# Patient Record
Sex: Female | Born: 1975 | Race: White | Hispanic: No | Marital: Married | Smoking: Never smoker
Health system: Southern US, Community
[De-identification: ages and names within clinical notes are randomized; demographics above are authoritative.]

## PROBLEM LIST (undated history)

## (undated) DIAGNOSIS — D582 Other hemoglobinopathies: Secondary | ICD-10-CM

## (undated) DIAGNOSIS — N926 Irregular menstruation, unspecified: Secondary | ICD-10-CM

## (undated) DIAGNOSIS — N736 Female pelvic peritoneal adhesions (postinfective): Secondary | ICD-10-CM

## (undated) DIAGNOSIS — N979 Female infertility, unspecified: Secondary | ICD-10-CM

## (undated) DIAGNOSIS — N83209 Unspecified ovarian cyst, unspecified side: Secondary | ICD-10-CM

## (undated) DIAGNOSIS — R3915 Urgency of urination: Secondary | ICD-10-CM

## (undated) DIAGNOSIS — R319 Hematuria, unspecified: Secondary | ICD-10-CM

## (undated) DIAGNOSIS — N809 Endometriosis, unspecified: Secondary | ICD-10-CM

## (undated) DIAGNOSIS — IMO0002 Reserved for concepts with insufficient information to code with codable children: Secondary | ICD-10-CM

## (undated) DIAGNOSIS — I10 Essential (primary) hypertension: Secondary | ICD-10-CM

## (undated) DIAGNOSIS — K219 Gastro-esophageal reflux disease without esophagitis: Secondary | ICD-10-CM

## (undated) DIAGNOSIS — F419 Anxiety disorder, unspecified: Secondary | ICD-10-CM

## (undated) DIAGNOSIS — K589 Irritable bowel syndrome without diarrhea: Secondary | ICD-10-CM

## (undated) DIAGNOSIS — N898 Other specified noninflammatory disorders of vagina: Secondary | ICD-10-CM

## (undated) DIAGNOSIS — M5020 Other cervical disc displacement, unspecified cervical region: Secondary | ICD-10-CM

## (undated) DIAGNOSIS — E785 Hyperlipidemia, unspecified: Secondary | ICD-10-CM

## (undated) DIAGNOSIS — R87629 Unspecified abnormal cytological findings in specimens from vagina: Secondary | ICD-10-CM

## (undated) HISTORY — DX: Female infertility, unspecified: N97.9

## (undated) HISTORY — DX: Female pelvic peritoneal adhesions (postinfective): N73.6

## (undated) HISTORY — DX: Other cervical disc displacement, unspecified cervical region: M50.20

## (undated) HISTORY — DX: Irritable bowel syndrome, unspecified: K58.9

## (undated) HISTORY — DX: Hyperlipidemia, unspecified: E78.5

## (undated) HISTORY — DX: Unspecified ovarian cyst, unspecified side: N83.209

## (undated) HISTORY — DX: Unspecified abnormal cytological findings in specimens from vagina: R87.629

## (undated) HISTORY — DX: Urgency of urination: R39.15

## (undated) HISTORY — PX: MOLE REMOVAL: SHX2046

## (undated) HISTORY — PX: HERNIA REPAIR: SHX51

## (undated) HISTORY — DX: Other specified noninflammatory disorders of vagina: N89.8

## (undated) HISTORY — DX: Essential (primary) hypertension: I10

## (undated) HISTORY — DX: Anxiety disorder, unspecified: F41.9

## (undated) HISTORY — DX: Irregular menstruation, unspecified: N92.6

## (undated) HISTORY — DX: Reserved for concepts with insufficient information to code with codable children: IMO0002

## (undated) HISTORY — DX: Hematuria, unspecified: R31.9

## (undated) HISTORY — DX: Endometriosis, unspecified: N80.9

## (undated) HISTORY — DX: Other hemoglobinopathies: D58.2

## (undated) HISTORY — DX: Gastro-esophageal reflux disease without esophagitis: K21.9

---

## 2004-05-19 ENCOUNTER — Emergency Department: Payer: Self-pay | Admitting: Emergency Medicine

## 2005-05-24 ENCOUNTER — Observation Stay: Payer: Self-pay | Admitting: Obstetrics & Gynecology

## 2005-05-30 ENCOUNTER — Observation Stay: Payer: Self-pay | Admitting: Unknown Physician Specialty

## 2005-05-30 ENCOUNTER — Inpatient Hospital Stay: Payer: Self-pay | Admitting: Obstetrics & Gynecology

## 2006-01-20 ENCOUNTER — Ambulatory Visit: Payer: Self-pay | Admitting: Gastroenterology

## 2006-04-06 ENCOUNTER — Ambulatory Visit: Payer: Self-pay | Admitting: Obstetrics & Gynecology

## 2008-02-26 ENCOUNTER — Observation Stay: Payer: Self-pay

## 2008-03-13 ENCOUNTER — Ambulatory Visit: Payer: Self-pay | Admitting: Obstetrics & Gynecology

## 2008-03-14 ENCOUNTER — Inpatient Hospital Stay: Payer: Self-pay | Admitting: Unknown Physician Specialty

## 2008-11-05 ENCOUNTER — Ambulatory Visit: Payer: Self-pay | Admitting: Gastroenterology

## 2008-11-06 ENCOUNTER — Ambulatory Visit: Payer: Self-pay | Admitting: Gastroenterology

## 2009-04-05 ENCOUNTER — Ambulatory Visit: Payer: Self-pay | Admitting: Internal Medicine

## 2009-08-10 ENCOUNTER — Inpatient Hospital Stay: Payer: Self-pay | Admitting: Unknown Physician Specialty

## 2009-11-18 ENCOUNTER — Ambulatory Visit: Payer: Self-pay | Admitting: Internal Medicine

## 2011-09-13 ENCOUNTER — Emergency Department: Payer: Self-pay | Admitting: Unknown Physician Specialty

## 2011-09-13 LAB — COMPREHENSIVE METABOLIC PANEL
Anion Gap: 6 — ABNORMAL LOW (ref 7–16)
BUN: 11 mg/dL (ref 7–18)
Bilirubin,Total: 0.2 mg/dL (ref 0.2–1.0)
Chloride: 107 mmol/L (ref 98–107)
Creatinine: 0.68 mg/dL (ref 0.60–1.30)
EGFR (African American): >60
Osmolality: 280 (ref 275–301)
SGPT (ALT): 16 U/L
Sodium: 141 mmol/L (ref 136–145)
Total Protein: 8.4 g/dL — ABNORMAL HIGH (ref 6.4–8.2)

## 2011-09-13 LAB — URINALYSIS, COMPLETE
Bacteria: NONE SEEN
Bilirubin,UR: NEGATIVE
Ketone: NEGATIVE
Leukocyte Esterase: NEGATIVE
RBC,UR: <1 /HPF (ref 0–5)
Specific Gravity: 1.002 (ref 1.003–1.030)
Squamous Epithelial: 1

## 2011-09-13 LAB — CBC
HCT: 40 % (ref 35.0–47.0)
MCHC: 32.4 g/dL (ref 32.0–36.0)
MCV: 90 fL (ref 80–100)
RBC: 4.46 10*6/uL (ref 3.80–5.20)
RDW: 13.5 % (ref 11.5–14.5)
WBC: 8 10*3/uL (ref 3.6–11.0)

## 2011-10-07 HISTORY — PX: VAGINAL HYSTERECTOMY: SUR661

## 2011-10-08 ENCOUNTER — Ambulatory Visit: Payer: Self-pay | Admitting: Surgery

## 2012-04-19 ENCOUNTER — Ambulatory Visit: Payer: Self-pay | Admitting: Gastroenterology

## 2013-02-21 LAB — HM PAP SMEAR

## 2013-09-18 ENCOUNTER — Ambulatory Visit: Payer: Self-pay | Admitting: Obstetrics and Gynecology

## 2013-09-18 LAB — CBC
HCT: 35.4 % (ref 35.0–47.0)
HGB: 11.4 g/dL — ABNORMAL LOW (ref 12.0–16.0)
MCH: 27.8 pg (ref 26.0–34.0)
MCHC: 32.1 g/dL (ref 32.0–36.0)
MCV: 86 fL (ref 80–100)
PLATELETS: 180 10*3/uL (ref 150–440)
RBC: 4.1 10*6/uL (ref 3.80–5.20)
RDW: 14.7 % — AB (ref 11.5–14.5)
WBC: 6.2 10*3/uL (ref 3.6–11.0)

## 2013-09-24 ENCOUNTER — Ambulatory Visit: Payer: Self-pay | Admitting: Obstetrics and Gynecology

## 2013-11-01 ENCOUNTER — Ambulatory Visit: Payer: Self-pay | Admitting: Obstetrics and Gynecology

## 2013-11-01 LAB — CBC
HCT: 35 % (ref 35.0–47.0)
HGB: 10.8 g/dL — ABNORMAL LOW (ref 12.0–16.0)
MCH: 27 pg (ref 26.0–34.0)
MCHC: 30.8 g/dL — AB (ref 32.0–36.0)
MCV: 88 fL (ref 80–100)
PLATELETS: 195 10*3/uL (ref 150–440)
RBC: 4 10*6/uL (ref 3.80–5.20)
RDW: 14.4 % (ref 11.5–14.5)
WBC: 5.6 10*3/uL (ref 3.6–11.0)

## 2013-11-05 ENCOUNTER — Ambulatory Visit: Payer: Self-pay | Admitting: Obstetrics and Gynecology

## 2013-11-06 LAB — HEMOGLOBIN: HGB: 9.2 g/dL — AB (ref 12.0–16.0)

## 2013-11-08 LAB — PATHOLOGY REPORT

## 2014-01-15 LAB — HM PAP SMEAR: HM PAP: NEGATIVE

## 2014-01-26 ENCOUNTER — Ambulatory Visit: Payer: Self-pay | Admitting: Physician Assistant

## 2014-06-25 NOTE — Op Note (Signed)
PATIENT NAME:  Savannah Richards, [name] MR#:  [id number] DATE OF BIRTH:  1975-08-21  DATE OF PROCEDURE:  10/08/2011  PREOPERATIVE DIAGNOSIS: Umbilical hernia.   POSTOPERATIVE DIAGNOSIS: Umbilical hernia.   OPERATION: Umbilical hernia repair.   SURGEON: Quentin Orealph L. Ely, MD.   ANESTHESIA: General.   OPERATIVE PROCEDURE: With the patient in the supine position after induction of appropriate general anesthesia, the patient's abdomen was prepped with ChloraPrep and draped with sterile towels. A small supraumbilical incision was made longitudinally and carried down through the subcutaneous tissue with Bovie electrocautery. The hernia defect was identified and appeared to be a quite small umbilical defect although there was some laxity in the abdominal wall above that. The laxity was carefully examined and did not appear to be any fascial defect. An instrument was inserted in the preperitoneal space through the umbilical defect and, again, the area carefully palpated. No other defect was encountered. The abdominal fascia in that area was quite lax in the form of ventral diastases. The hernia defect was closed with a pants over vest closure of 0 Surgilon and then the midline was plicated for about 5 cm in the superior direction. The area was irrigated. The area was infiltrated with 0.25% Marcaine for postoperative pain control. The umbilical skin was reapproximated with 0 Vicryl to the umbilical repair and then the skin closed using a running subcuticular suture of 3-0 Vicryl. Steri-Strips were applied. The patient was returned to the recovery room having tolerated the procedure well. Sponge, instrument, and needle counts were correct x2 in the Operating Room.   ____________________________ Quentin Orealph L. Ely III, MD rle:ap D: 10/08/2011 08:13:26 ET T: 10/08/2011 12:13:35 ET JOB#: [id number] cc: Carmie Endalph L. Ely III, MD, <Dictator> R. Annamarie MajorPaul Harris, MD Quentin OreALPH L ELY MD ELECTRONICALLY SIGNED 10/08/2011 18:42

## 2014-06-29 NOTE — Op Note (Signed)
PATIENT NAME:  Savannah Richards, [name] MR#:  [id number] DATE OF BIRTH:  [birth year]  DATE OF PROCEDURE:  09/24/2013  PREOPERATIVE DIAGNOSIS: Chronic pelvic pain.   POSTOPERATIVE DIAGNOSES: 1.  Chronic pelvic pain.  2.  Endometriosis.  3.  Pelvic adhesive disease.    OPERATIVE PROCEDURE: Laparoscopy with adhesiolysis.   SURGEON: Herold HarmsMartin A Riccardo Holeman, M.D.   FIRST ASSISTANT: None.   ANESTHESIA: General endotracheal.   INDICATIONS: The patient is a 39 year old white female, gravida 3, para 3-0-1-3, who is status post BTL, presents for surgical evaluation of chronic pelvic pain with worsening dysmenorrhea and deep thrusting dyspareunia. The patient had history of previous cesarean section.   FINDINGS AT SURGERY: Revealed a gynecoid pelvis. The uterus had descensus to within 1 cm of the introitus. She is an excellent vaginal hysterectomy candidate.   Laparoscopy demonstrated adhesions between the anterior abdominal wall, omentum and bowel; these adhesions were lysed. There was evidence of endometriosis with a powder burn implant on the left infundibulopelvic ligament; this abnormality was not biopsied because of it lying over the major vessel. There was mild bladder flap scarring. There was a simple right ovarian cyst, 3 cm, identified. Upper abdomen was normal.   DESCRIPTION OF PROCEDURE: The patient was brought to the Operating Room where she was placed in the supine position. General endotracheal anesthesia was induced without difficulty. She was placed in the dorsal lithotomy position, using the bumblebee stirrups. A ChloraPrep and Betadine abdominal, perineal, intravaginal prep and drape was performed in standard fashion. A red Robinson catheter was used to drain 100 mL of urine from the bladder. A Hulka tenaculum was placed onto the cervix.   A subumbilical vertical incision 5 mm in length was made. The Optiview laparoscopic trocar was placed directly into the abdominal pelvic cavity under direct  visualization; no bowel or vascular injury was encountered. Pneumoperitoneum was created. The above-noted findings were photo documented. A right lateral abdominal wall 5 mm port was placed under direct visualization. Through this port, Kleppinger bipolar forceps were used to cauterize the adhesions near the anterior abdominal wall. They were then taken down with scissors. Good hemostasis was noted following confirmation of the endometriosis. No biopsies were taken. The procedure was then terminated, with all instrumentation being removed from the abdomen. The pneumoperitoneum was released. The incisions were closed with Dermabond glue. The patient was then awakened, extubated and taken to the recovery room in satisfactory condition.   ESTIMATED BLOOD LOSS: Minimal.   IV FLUIDS: 1 liter of crystalloid.   URINE OUTPUT: 100 mL.   All instrument, needle, and sponge counts were verified as correct.    ____________________________ Prentice DockerMartin A. Onyx Edgley, MD mad:cg D: 09/24/2013 08:32:06 ET T: 09/24/2013 08:50:52 ET JOB#: [id number]  cc: Daphine DeutscherMartin A. Milika Ventress, MD, <Dictator> Prentice DockerMARTIN A Darold Miley MD ELECTRONICALLY SIGNED 09/26/2013 6:13

## 2014-06-29 NOTE — Op Note (Signed)
PATIENT NAME:  Savannah Richards, Savannah Richards MR#:  [id number] DATE OF BIRTH:  Feb 28, 1976  DATE OF PROCEDURE:  11/05/2013  PREOPERATIVE DIAGNOSES: 1.  Symptomatic endometriosis.  2.  Abnormal uterine bleeding.  3.  Chronic pelvic pain. 4.  Uterine prolapse.   POSTOPERATIVE DIAGNOSES:  1.  Symptomatic endometriosis.  2.  Abnormal uterine bleeding.  3.  Chronic pelvic pain. 4.  Uterine prolapse.   OPERATIVE PROCEDURE: TVH, bilateral salpingectomy.   SURGEON: Dr. Greggory KeeneFrancesco.   FIRST ASSISTANT: Dr. Valentino Saxonherry.   ANESTHESIA: General endotracheal.   INDICATIONS: The patient is a 39 year old white female, para 3-0-1-3, using vasectomy for contraception, who has symptomatic endometriosis confirmed by laparoscopy, 09/24/2013, presents for definitive surgery.   FINDINGS AT SURGERY: Revealed uterine prolapse to within 2 cm of the introitus. The uterus and fallopian tubes were normal bilaterally. The ovaries had bilateral ovarian cysts.   DESCRIPTION OF PROCEDURE: The patient was brought to the operating room where she was placed in the supine position. General endotracheal anesthesia was induced without difficulty. She was placed in the dorsal lithotomy position using the candycane stirrups. A ChloraPrep abdominal, perineal, intravaginal prep and drape was performed in the standard fashion. A Foley catheter was placed, was draining clear yellow urine. Vaginal hysterectomy was performed in standard fashion. A weighted speculum was placed into the vagina and a double-toothed tenaculum was placed onto the cervix. Posterior colpotomy was made with Mayo scissors. Uterosacral ligaments were clamped, cut, and stick tied using 0 Vicryl suture. The posterior cuff was run with a 0 Vicryl suture in a running baseball stitch manner. Next, the cervix was circumscribed with the Bovie cautery. The bladder was dissected off the lower uterine segment through a sharp and blunt dissection. The anterior cul-de-sac was entered. The  cardinal broad ligament complexes were then sequentially clamped, cut, and stick tied using 0 Vicryl suture. This was carried out up to the level of the utero-ovarian ligaments, which were then clamped, cut, and stick tied. Following this, the adnexa was grasped with a Babcock clamp and the fallopian tubes were isolated and crossclamped using a curved Heaney clamp. This specimen was excised. Suture LigaSure was placed with good hemostasis. A similar procedure was carried out on the contralateral side. Once satisfied with hemostasis of all pedicles, the peritoneum was closed using a pursestring stitch of 0 Vicryl. This was followed by closing the vagina with simple interrupted sutures of 2-0 chromic. Upon completion of the procedure, all instrumentation was removed from the vagina. The patient was then awakened, extubated and taken to the recovery room in satisfactory condition.   ESTIMATED BLOOD LOSS: Was 300 mL.   IV FLUIDS: Was 1100 mL   URINE OUTPUT: 350 mL.  COUNTS:  All instruments, needle, and sponge counts were verified as correct.   The patient did receive Ancef antibiotic prophylaxis.     ____________________________ Prentice DockerMartin A. Lucindy Borel, MD mad:lr D: 11/05/2013 12:06:38 ET T: 11/05/2013 13:17:38 ET JOB#: [id number]  cc: Daphine DeutscherMartin A. Cala Kruckenberg, MD, <Dictator> [facility] Prentice DockerMARTIN A Niesha Bame MD ELECTRONICALLY SIGNED 11/15/2013 8:07

## 2014-10-24 ENCOUNTER — Telehealth: Payer: Self-pay | Admitting: Obstetrics and Gynecology

## 2014-10-24 NOTE — Telephone Encounter (Signed)
Patient called stating she has been having mood swings since her hysterectomy. She would like a call back .Thanks

## 2014-10-24 NOTE — Telephone Encounter (Signed)
Pt states she is having really bad mood swings. Noticed since her hysterectomy a year ago, but worse over the last 6 months. She is ill, miserable, and just not happy. Appt made on  10/30/2014 at 7:30.

## 2014-10-30 ENCOUNTER — Ambulatory Visit (INDEPENDENT_AMBULATORY_CARE_PROVIDER_SITE_OTHER): Payer: Federal, State, Local not specified - PPO | Admitting: Obstetrics and Gynecology

## 2014-10-30 ENCOUNTER — Telehealth: Payer: Self-pay | Admitting: Obstetrics and Gynecology

## 2014-10-30 ENCOUNTER — Encounter: Payer: Self-pay | Admitting: Obstetrics and Gynecology

## 2014-10-30 VITALS — BP 151/85 | HR 71 | Ht 65.0 in | Wt 139.3 lb

## 2014-10-30 DIAGNOSIS — E785 Hyperlipidemia, unspecified: Secondary | ICD-10-CM

## 2014-10-30 DIAGNOSIS — F419 Anxiety disorder, unspecified: Secondary | ICD-10-CM | POA: Diagnosis not present

## 2014-10-30 DIAGNOSIS — R030 Elevated blood-pressure reading, without diagnosis of hypertension: Secondary | ICD-10-CM | POA: Diagnosis not present

## 2014-10-30 DIAGNOSIS — R7309 Other abnormal glucose: Secondary | ICD-10-CM | POA: Diagnosis not present

## 2014-10-30 DIAGNOSIS — IMO0001 Reserved for inherently not codable concepts without codable children: Secondary | ICD-10-CM

## 2014-10-30 MED ORDER — SERTRALINE HCL 50 MG PO TABS: 50.0000 mg | ORAL_TABLET | Freq: Every day | ORAL | Status: DC

## 2014-10-30 NOTE — Patient Instructions (Signed)
1.  Start Zoloft 50 mg.  Begin with one half tablet a day for a week.  Then, 1 tablet a day thereafter. 2.  Increase exercise daily. 3.  Maintain healthy diet. 4.  Symptom diary as discussed. 5.  Return in 6 weeks for follow-up. 6.  Lab work completed today. 7.  Monitor blood pressure.

## 2014-10-30 NOTE — Telephone Encounter (Signed)
PT AWARE PER VM ZOLOFT ERX.

## 2014-10-30 NOTE — Telephone Encounter (Signed)
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

## 2014-10-30 NOTE — Progress Notes (Signed)
Patient ID: Savannah Richards, female   DOB: [birth year], 39 y.o.   MRN: [id number] Pt having mood swings that have worsened since hysterectomy. No hot flashes or night sweats. Zung score 6.  Chief complaint: 1.  Anxiety. 2.  Decreased sex drive. 3.  Elevated blood pressure.  Patient has noted worsening mood swings since December 2015.  She has not been able to determine if they are cyclic.  She is status post TVH, bilateral salpingectomy.  She does not have suicidal ideations.  The patient does have past history of PMS. Patient also is complaining of decreased sex drive.  She still is capable of enjoying orgasm. She does not have any new health diagnoses. Stressors at work are fairly stable. Stressors at home are fairly stable.  Zung questionnaire is normal.  IMPRESSION: 1.  Anxiety. 2.  Elevated blood pressure, unclear etiology. 3.  Blood work needed.  PLAN: 1.  Exercise encouraged 30 minutes a day. 2.  Healthy dieting encouraged. 3.  Symptom questionnaire started. 4.  Counseling is encouraged. 5.  Begin Zoloft 50 mg a day. 6.  Recheck blood pressure at next visit. 7.  Screening blood work is obtained today. 8.  Return in 6 weeks.  A total of 25 minutes were spent face-to-face with the patient during this encounter and over half of that time involved counseling and coordination of care.  Herold Harms, MD

## 2014-10-31 ENCOUNTER — Telehealth: Payer: Self-pay

## 2014-10-31 DIAGNOSIS — E785 Hyperlipidemia, unspecified: Secondary | ICD-10-CM

## 2014-10-31 DIAGNOSIS — R7309 Other abnormal glucose: Secondary | ICD-10-CM

## 2014-10-31 LAB — LIPID PANEL
CHOL/HDL RATIO: 4.6 ratio — AB (ref 0.0–4.4)
CHOLESTEROL TOTAL: 231 mg/dL — AB (ref 100–199)
HDL: 50 mg/dL (ref >39)
LDL Calculated: 165 mg/dL — ABNORMAL HIGH (ref 0–99)
TRIGLYCERIDES: 79 mg/dL (ref 0–149)
VLDL Cholesterol Cal: 16 mg/dL (ref 5–40)

## 2014-10-31 LAB — HEMOGLOBIN A1C
Est. average glucose Bld gHb Est-mCnc: 123 mg/dL
HEMOGLOBIN A1C: 5.9 % — AB (ref 4.8–5.6)

## 2014-10-31 NOTE — Telephone Encounter (Signed)
Pt aware. Labs ordered. Lab appt made for 11/8. Prior to AE.

## 2014-10-31 NOTE — Telephone Encounter (Signed)
-----   Message from Herold Harms, MD sent at 10/31/2014  7:47 AM EDT ----- Please notify - Abnormal Labs Hemoglobin A1c is consistent with prediabetes. Lipid one panel is elevated with total cholesterol and LDL cholesterol, high. Recommendations: Increase exercise to 30 minutes a day 5 days a week, Institute healthier diet, recheck labs in 6 months.

## 2014-12-11 ENCOUNTER — Encounter: Payer: Self-pay | Admitting: Obstetrics and Gynecology

## 2014-12-11 ENCOUNTER — Ambulatory Visit (INDEPENDENT_AMBULATORY_CARE_PROVIDER_SITE_OTHER): Payer: Federal, State, Local not specified - PPO | Admitting: Obstetrics and Gynecology

## 2014-12-11 VITALS — BP 147/91 | HR 76 | Ht 65.0 in | Wt 133.3 lb

## 2014-12-11 DIAGNOSIS — F419 Anxiety disorder, unspecified: Secondary | ICD-10-CM

## 2014-12-11 DIAGNOSIS — R030 Elevated blood-pressure reading, without diagnosis of hypertension: Secondary | ICD-10-CM

## 2014-12-11 DIAGNOSIS — E785 Hyperlipidemia, unspecified: Secondary | ICD-10-CM | POA: Diagnosis not present

## 2014-12-11 DIAGNOSIS — IMO0001 Reserved for inherently not codable concepts without codable children: Secondary | ICD-10-CM

## 2014-12-11 DIAGNOSIS — R7303 Prediabetes: Secondary | ICD-10-CM

## 2014-12-11 NOTE — Progress Notes (Signed)
Patient ID: Savannah Richards, female   DOB: Jun 03, 1975, 39 y.o.   MRN: [id number] Pt states she is feeling much better, more like herself. Had difficult morning-B/P 147/91. Continues with Zoloft. To have HbgA1c, Lipid panel repeated today.  Chief complaint: 1.  Anxiety. 2.  Elevated blood pressure. 3.  Elevated hemoglobin A1c, cholesterol.  ANXIETY: Patient presents today for follow-up on above issues.  She was started on Zoloft 50 mg a day.  6 weeks ago.  Early side effects were noted including mild headaches which have since resolved.  She states that her moods are markedly improved and she feels more like herself.  ELEVATED BLOOD PRESSURE: Patient has increased exercise and has lost 7-10 pounds since her last visit.  She does report having had a difficult morning with her children and her blood pressure is 147/91.  We discussed importance of blood pressure control and she will maintain a blood pressure diary.  3 times a week over the next month.  She will return for reevaluation in 4 weeks.  ELEVATED HEMOGLOBIN A1C; ELEVATED CHOLESTEROL: Patient was to have blood work drawn today.  However, this is only 2 months since her last blood draw in October 30, 2014.  We will defer her blood draw until next visit.  ASSESSMENT: 1.  Anxiety, with improvement in symptomatology on medication. 2.  Elevated blood pressure, borderline today; weight loss, notable for past month. 3.  Elevated hemoglobin A1c and abnormal lipid panel; will defer blood draw until next month.  PLAN: 1.  Continue Zoloft 50 mg a day. 2.  Continue with healthier eating and exercise. 3.  Maintain blood pressure diary at work 3 times a week. 4.  Return in 4 weeks for follow-up and laboratory work.  A total of 15 minutes were spent face-to-face with the patient during this encounter and over half of that time dealt with counseling and coordination of care.  Herold Harms, MD

## 2014-12-11 NOTE — Patient Instructions (Signed)
1.  Continue with Zoloft 50 mg a day. 2.  Maintain blood pressure diary at work 3 times a week. 3.  Will hold off on blood work today until next visit. 4.  Continue with healthy eating and exercise 5.  Return in 4 weeks for follow-up.

## 2015-01-08 ENCOUNTER — Ambulatory Visit: Payer: Federal, State, Local not specified - PPO | Admitting: Obstetrics and Gynecology

## 2015-01-14 ENCOUNTER — Other Ambulatory Visit: Payer: Self-pay

## 2015-01-22 ENCOUNTER — Encounter: Payer: Self-pay | Admitting: Obstetrics and Gynecology

## 2015-01-22 ENCOUNTER — Ambulatory Visit (INDEPENDENT_AMBULATORY_CARE_PROVIDER_SITE_OTHER): Payer: Federal, State, Local not specified - PPO | Admitting: Obstetrics and Gynecology

## 2015-01-22 VITALS — BP 143/95 | HR 72 | Ht 65.0 in | Wt 130.8 lb

## 2015-01-22 DIAGNOSIS — I10 Essential (primary) hypertension: Secondary | ICD-10-CM

## 2015-01-22 DIAGNOSIS — F419 Anxiety disorder, unspecified: Secondary | ICD-10-CM

## 2015-01-22 DIAGNOSIS — Z01419 Encounter for gynecological examination (general) (routine) without abnormal findings: Secondary | ICD-10-CM | POA: Diagnosis not present

## 2015-01-22 DIAGNOSIS — L659 Nonscarring hair loss, unspecified: Secondary | ICD-10-CM | POA: Insufficient documentation

## 2015-01-22 MED ORDER — ESCITALOPRAM OXALATE 10 MG PO TABS: 10.0000 mg | ORAL_TABLET | Freq: Every day | ORAL | Status: DC

## 2015-01-22 NOTE — Progress Notes (Signed)
Patient ID: Savannah Richards, female   DOB: September 06, 1975, 39 y.o.   MRN: [id number] [facility]NU[facility] GYN  ENCOUNTER NOTE  Subjective:       Savannah Richards is a 39 y.o. [phone] female here for a routine annual gynecologic exam.  Current complaints: 1.  Change zoloft d/t hair falling out 2.  Hypertension    See bp log (136-159/91-104)   Gynecologic History No LMP recorded. Patient has had a hysterectomy.status post TVH Contraception: status post hysterectomystatus post TVH Last Pap: 01/2014 neg/neg. Results were: normal Last mammogram: n/a. Results were: n/ASSESSMENT: History of endometriosis; Currently asymptomatic  Obstetric History OB History  Gravida Para Term Preterm AB SAB TAB Ectopic Multiple Living  # Outcome Date GA Lbr Len/2nd Weight Sex Delivery Anes PTL Lv  4 Term 2011   8 lb 1.8 oz (3.679 kg) M CS-LTranv   Y  3 Term 2010   8 lb 1.8 oz (3.679 kg)  CS-LTranv   Y  2 SAB           1 Term    8 lb 1.9 oz (3.683 kg) M Vag-Spont   Y      Past Medical History  Diagnosis Date  . IBS (irritable bowel syndrome)   . Infertility, female   . GERD (gastroesophageal reflux disease)   . Elevated lipids   . Elevated hemoglobin (HCC)   . Endometriosis   . Dyspareunia   . Irregular periods   . Ovarian cyst   . Vaginal Pap smear, abnormal   . Urinary urgency   . Pelvic adhesions   . Vaginal discharge   . Hematuria     Past Surgical History  Procedure Laterality Date  . Mole removal      multiple  . Vaginal hysterectomy  10/2011    bilateral salpingectomy  . Hernia repair      umbilicus  . Cesarean section      x 2    Current Outpatient Prescriptions on File Prior to Visit  Medication Sig Dispense Refill  . cetirizine (ZYRTEC) 10 MG tablet Take 10 mg by mouth daily.    Marland Kitchen dexlansoprazole (DEXILANT) 60 MG capsule Take 60 mg by mouth daily.    Marland Kitchen dicyclomine (BENTYL) 10 MG capsule Take 10 mg by mouth 4 (four) times daily -  before  meals and at bedtime.    . sertraline (ZOLOFT) 50 MG tablet Take 1 tablet (50 mg total) by mouth daily. 30 tablet 6   No current facility-administered medications on file prior to visit.    Allergies  Allergen Reactions  . Bactrim [Sulfamethoxazole-Trimethoprim]   . Ciprofloxacin Hcl   . Shellfish Allergy     Social History   Social History  . Marital Status: Married    Spouse Name: N/A  . Number of Children: N/A  . Years of Education: N/A   Occupational History  . Not on file.   Social History Main Topics  . Smoking status: Never Smoker   . Smokeless tobacco: Never Used  . Alcohol Use: No  . Drug Use: No  . Sexual Activity:    Partners: Female    Pharmacist, hospital Protection: Surgical   Other Topics Concern  . Not on file   Social History Narrative    Family History  Problem Relation Age of Onset  . Osteoporosis Mother   . Endometriosis Mother   . Diabetes Father   .  Heart disease Father   . Diabetes Maternal Grandfather   . Colon cancer Paternal Grandmother   . Diabetes Paternal Grandfather   . Breast cancer Neg Hx   . Ovarian cancer Neg Hx     The following portions of the patient's history were reviewed and updated as appropriate: allergies, current medications, past family history, past medical history, past social history, past surgical history and problem list.  Review of Systems ROS Review of Systems - General ROS: negative for - chills, fatigue, fever, hot flashes, night sweats, weight gain or weight loss Psychological ROS: negative for - anxiety, decreased libido, depression, mood swings, physical abuse or sexual abuse Ophthalmic ROS: negative for - blurry vision, eye pain or loss of vision ENT ROS: negative for - headaches, hearing change, visual changes or vocal changes Allergy and Immunology ROS: negative for - hives, itchy/watery eyes or seasonal allergies Hematological and Lymphatic ROS: negative for - bleeding problems, bruising, swollen lymph  nodes or weight loss Endocrine ROS: negative for - galactorrhea, hair pattern changes, hot flashes, malaise/lethargy, mood swings, palpitations, polydipsia/polyuria, skin changes, temperature intolerance or unexpected weight changes Breast ROS: negative for - new or changing breast lumps or nipple discharge Respiratory ROS: negative for - cough or shortness of breath Cardiovascular ROS: negative for - chest pain, irregular heartbeat, palpitations or shortness of breath Gastrointestinal ROS: no abdominal pain, change in bowel habits, or black or bloody stools Genito-Urinary ROS: no dysuria, trouble voiding, or hematuria Musculoskeletal ROS: negative for - joint pain or joint stiffness Neurological ROS: negative for - bowel and bladder control changes Dermatological ROS: negative for rash and skin lesion changes; POSITIVE-hair loss   Objective:   BP 143/95 mmHg  Pulse 72  Ht 5\' 5"  (1.651 m)  Wt 130 lb 12.8 oz (59.33 kg)  BMI 21.77 kg/m2 CONSTITUTIONAL: Well-developed, well-nourished female in no acute distress.  PSYCHIATRIC: Normal mood and affect. Normal behavior. Normal judgment and thought content. NEUROLGIC: Alert and oriented to person, place, and time. Normal muscle tone coordination. No cranial nerve deficit noted. HENT:  Normocephalic, atraumatic, External right and left ear normal. Oropharynx is clear and moist EYES: Conjunctivae and EOM are normal. Pupils are equal, round, and reactive to light. No scleral icterus.  NECK: Normal range of motion, supple, no masses.  Normal thyroid.  SKIN: Skin is warm and dry. No rash noted. Not diaphoretic. No erythema. No pallor. CARDIOVASCULAR: Normal heart rate noted, regular rhythm, no murmur. RESPIRATORY: Clear to auscultation bilaterally. Effort and breath sounds normal, no problems with respiration noted. BREASTS: Symmetric in size. No masses, skin changes, nipple drainage, or lymphadenopathy. ABDOMEN: Soft, normal bowel sounds, no  distention noted.  No tenderness, rebound or guarding.  BLADDER: Normal PELVIC:  External Genitalia: Normal  BUS: Normal  Vagina: Normal  Cervix: surgically absent  Uterus: surgically absent  Adnexa: Normal; no masses; no tenderness  RV: External Exam NormaI  MUSCULOSKELETAL: Normal range of motion. No tenderness.  No cyanosis, clubbing, or edema.  2+ distal pulses. LYMPHATIC: No Axillary, Supraclavicular, or Inguinal Adenopathy.    Assessment:   Annual gynecologic examination 39 y.o. Contraception: tubal ligation Normal BMI Hypertension. Hair loss, possibly related to Zoloft. Anxiety, stable  Plan:  Pap: Not needed Mammogram: Not Indicated Stool Guaiac Testing:  Not Indicated Labs: vit d lipid a1c fbs tsh cmp Routine preventative health maintenance measures emphasized: Exercise/Diet/Weight control, Tobacco Warnings and Alcohol/Substance use risks Referral for hypertension evaluation and management. Switch Zoloft to Lexapro 10 mg a day Return to Clinic -  1 Year   [employer], CMA  Herold Harms, MD  Note: This dictation was prepared with Dragon dictation along with smaller phrase technology. Any transcriptional errors that result from this process are unintentional.

## 2015-01-22 NOTE — Patient Instructions (Signed)
1.  No Pap smear needed until 2018 2.  Stop Zoloft.  Start Lexapro 10 mg a day. 3.  Referral to primary care for hypertension evaluation and management. 4.  Continue with healthy eating and exercise.  Maintain weight with normal BMI. 5.  Return in one year for annual exam.

## 2015-01-22 NOTE — Addendum Note (Signed)
Addended by: Marchelle FolksMILLER, Loraine G on: 01/22/2015 09:59 AM   Modules accepted: Orders

## 2015-01-23 LAB — COMPREHENSIVE METABOLIC PANEL
ALK PHOS: 66 IU/L (ref 39–117)
ALT: 11 IU/L (ref 0–32)
AST: 14 IU/L (ref 0–40)
Albumin/Globulin Ratio: 1.5 (ref 1.1–2.5)
Albumin: 4.7 g/dL (ref 3.5–5.5)
BILIRUBIN TOTAL: 0.5 mg/dL (ref 0.0–1.2)
BUN/Creatinine Ratio: 25 — ABNORMAL HIGH (ref 8–20)
BUN: 16 mg/dL (ref 6–20)
CHLORIDE: 102 mmol/L (ref 97–106)
CO2: 23 mmol/L (ref 18–29)
CREATININE: 0.63 mg/dL (ref 0.57–1.00)
Calcium: 9.5 mg/dL (ref 8.7–10.2)
GFR calc Af Amer: 133 mL/min/{1.73_m2} (ref >59)
GFR calc non Af Amer: 115 mL/min/{1.73_m2} (ref >59)
GLUCOSE: 92 mg/dL (ref 65–99)
Globulin, Total: 3.1 g/dL (ref 1.5–4.5)
Potassium: 4.2 mmol/L (ref 3.5–5.2)
Sodium: 138 mmol/L (ref 136–144)
Total Protein: 7.8 g/dL (ref 6.0–8.5)

## 2015-01-23 LAB — LIPID PANEL
CHOL/HDL RATIO: 4.6 ratio — AB (ref 0.0–4.4)
Cholesterol, Total: 219 mg/dL — ABNORMAL HIGH (ref 100–199)
HDL: 48 mg/dL (ref >39)
LDL Calculated: 155 mg/dL — ABNORMAL HIGH (ref 0–99)
Triglycerides: 80 mg/dL (ref 0–149)
VLDL Cholesterol Cal: 16 mg/dL (ref 5–40)

## 2015-01-23 LAB — TSH: TSH: 2.09 u[IU]/mL (ref 0.450–4.500)

## 2015-01-23 LAB — VITAMIN D 25 HYDROXY (VIT D DEFICIENCY, FRACTURES): VIT D 25 HYDROXY: 29 ng/mL — AB (ref 30.0–100.0)

## 2015-01-23 LAB — HEMOGLOBIN A1C
Est. average glucose Bld gHb Est-mCnc: 120 mg/dL
HEMOGLOBIN A1C: 5.8 % — AB (ref 4.8–5.6)

## 2015-02-17 ENCOUNTER — Ambulatory Visit (INDEPENDENT_AMBULATORY_CARE_PROVIDER_SITE_OTHER): Payer: Federal, State, Local not specified - PPO | Admitting: Family Medicine

## 2015-02-17 ENCOUNTER — Encounter: Payer: Self-pay | Admitting: Family Medicine

## 2015-02-17 VITALS — BP 140/98 | HR 67 | Temp 98.6°F | Resp 16 | Ht 65.0 in | Wt 132.4 lb

## 2015-02-17 DIAGNOSIS — I10 Essential (primary) hypertension: Secondary | ICD-10-CM | POA: Diagnosis not present

## 2015-02-17 DIAGNOSIS — R011 Cardiac murmur, unspecified: Secondary | ICD-10-CM

## 2015-02-17 MED ORDER — LOSARTAN POTASSIUM 50 MG PO TABS: 50.0000 mg | ORAL_TABLET | Freq: Every day | ORAL | Status: DC

## 2015-02-17 NOTE — Progress Notes (Signed)
Name: Savannah Richards   MRN: [id number]    DOB: Apr 15, 1975   Date:02/17/2015       Progress Note  Subjective  Chief Complaint  Chief Complaint  Patient presents with  . Hypertension    Chronic    Hypertension This is a chronic problem. The problem is uncontrolled. Pertinent negatives include no blurred vision, chest pain, headaches, palpitations or shortness of breath. Risk factors for coronary artery disease include family history. Past treatments include nothing. There is no history of kidney disease, CAD/MI or CVA.   patient referred for evaluation of elevated blood pressure by gynecology.  Blood Pressure at GYN office was 143/95, in our office it is 140/98.  Patient reports blood pressure has been higher than these 2 readings at home.  Past Medical History  Diagnosis Date  . IBS (irritable bowel syndrome)   . Infertility, female   . GERD (gastroesophageal reflux disease)   . Elevated lipids   . Elevated hemoglobin (HCC)   . Endometriosis   . Dyspareunia   . Irregular periods   . Ovarian cyst   . Vaginal Pap smear, abnormal   . Urinary urgency   . Pelvic adhesions   . Vaginal discharge   . Hematuria   . Anxiety     Past Surgical History  Procedure Laterality Date  . Mole removal      multiple  . Cesarean section      x 2  . Hernia repair      umbilicus  . Vaginal hysterectomy  10/2011    Family History  Problem Relation Age of Onset  . Osteoporosis Mother   . Endometriosis Mother   . Hypertension Mother   . Diabetes Father   . Heart disease Father   . Hypertension Father   . Diabetes Maternal Grandfather   . Colon cancer Paternal Grandmother   . Diabetes Paternal Grandfather   . Breast cancer Neg Hx   . Ovarian cancer Neg Hx   . Thyroid disease Sister   . Hypertension Brother   . Hyperlipidemia Brother     Social History   Social History  . Marital Status: Married    Spouse Name: N/A  . Number of Children: N/A  . Years of Education: N/A    Occupational History  . Not on file.   Social History Main Topics  . Smoking status: Never Smoker   . Smokeless tobacco: Never Used  . Alcohol Use: No  . Drug Use: No  . Sexual Activity:    Partners: Female    Pharmacist, hospitalBirth Control/ Protection: Surgical   Other Topics Concern  . Not on file   Social History Narrative    Current outpatient prescriptions:  .  dexlansoprazole (DEXILANT) 60 MG capsule, Take 60 mg by mouth daily., Disp: , Rfl:  .  dicyclomine (BENTYL) 10 MG capsule, Take 10 mg by mouth 4 (four) times daily -  before meals and at bedtime., Disp: , Rfl:  .  escitalopram (LEXAPRO) 10 MG tablet, Take 1 tablet (10 mg total) by mouth daily., Disp: 30 tablet, Rfl: 6 .  cetirizine (ZYRTEC) 10 MG tablet, Take 10 mg by mouth daily., Disp: , Rfl:  .  sertraline (ZOLOFT) 50 MG tablet, Take 1 tablet (50 mg total) by mouth daily. (Patient not taking: Reported on 02/17/2015), Disp: 30 tablet, Rfl: 6  Allergies  Allergen Reactions  . Bactrim [Sulfamethoxazole-Trimethoprim]   . Ciprofloxacin Hcl   . Shellfish Allergy     Review of Systems  Eyes: Negative for blurred vision.  Respiratory: Negative for shortness of breath.   Cardiovascular: Negative for chest pain and palpitations.  Neurological: Negative for headaches.    Objective  Filed Vitals:   02/17/15 1149  BP: 140/98  Pulse: 67  Temp: 98.6 F (37 C)  TempSrc: Oral  Resp: 16  Height:  (1.651 m)  Weight: 132 lb 6.4 oz (60.056 kg)  SpO2: 98%    Physical Exam  Constitutional: She is oriented to person, place, and time and well-developed, well-nourished, and in no distress.  HENT:  Head: Normocephalic and atraumatic.  Mouth/Throat: No posterior oropharyngeal erythema.  Eyes: Pupils are equal, round, and reactive to light.  Cardiovascular: Normal rate and regular rhythm.   Murmur heard. Pulmonary/Chest: Effort normal and breath sounds normal. She has no wheezes.  Abdominal: Soft. Bowel sounds are normal.  There is no tenderness.  Neurological: She is alert and oriented to person, place, and time.  Psychiatric: Mood, memory, affect and judgment normal.  Nursing note and vitals reviewed.    Assessment & Plan  1. Essential hypertension We'll start on ARB therapy for hypertension. Lab work from [facility] reviewed. - losartan (COZAAR) 50 MG tablet; Take 1 tablet (50 mg total) by mouth daily.  Dispense: 90 tablet; Refill: 0  2. Cardiac murmur  - Ambulatory referral to Cardiology   [facility] 02/17/2015 12:16 PM

## 2015-02-18 DIAGNOSIS — R011 Cardiac murmur, unspecified: Secondary | ICD-10-CM | POA: Insufficient documentation

## 2015-03-17 ENCOUNTER — Ambulatory Visit: Payer: Federal, State, Local not specified - PPO | Admitting: Family Medicine

## 2015-03-25 ENCOUNTER — Encounter: Payer: Self-pay | Admitting: Family Medicine

## 2015-03-25 ENCOUNTER — Ambulatory Visit (INDEPENDENT_AMBULATORY_CARE_PROVIDER_SITE_OTHER): Payer: Federal, State, Local not specified - PPO | Admitting: Family Medicine

## 2015-03-25 VITALS — BP 138/80 | HR 86 | Temp 98.6°F | Resp 18 | Ht 65.0 in | Wt 135.2 lb

## 2015-03-25 DIAGNOSIS — I10 Essential (primary) hypertension: Secondary | ICD-10-CM | POA: Diagnosis not present

## 2015-03-25 MED ORDER — LOSARTAN POTASSIUM 100 MG PO TABS: 100.0000 mg | ORAL_TABLET | Freq: Every day | ORAL | Status: DC

## 2015-03-25 NOTE — Progress Notes (Signed)
Name: Savannah Richards   MRN: [id number]    DOB: 07-Mar-1976   Date:03/25/2015       Progress Note  Subjective  Chief Complaint  Chief Complaint  Patient presents with  . Follow-up    1 mo  . Hypertension  . Hyperlipidemia  . Anxiety    Hypertension The problem has been gradually improving since onset. The problem is controlled. Pertinent negatives include no blurred vision or headaches. Past treatments include angiotensin blockers. The current treatment provides moderate improvement. There are no compliance problems.  There is no history of kidney disease, CAD/MI or CVA.    Past Medical History  Diagnosis Date  . IBS (irritable bowel syndrome)   . Infertility, female   . GERD (gastroesophageal reflux disease)   . Elevated lipids   . Elevated hemoglobin (HCC)   . Endometriosis   . Dyspareunia   . Irregular periods   . Ovarian cyst   . Vaginal Pap smear, abnormal   . Urinary urgency   . Pelvic adhesions   . Vaginal discharge   . Hematuria   . Anxiety     Past Surgical History  Procedure Laterality Date  . Mole removal      multiple  . Cesarean section      x 2  . Hernia repair      umbilicus  . Vaginal hysterectomy  10/2011    Family History  Problem Relation Age of Onset  . Osteoporosis Mother   . Endometriosis Mother   . Hypertension Mother   . Diabetes Father   . Heart disease Father   . Hypertension Father   . Diabetes Maternal Grandfather   . Colon cancer Paternal Grandmother   . Diabetes Paternal Grandfather   . Breast cancer Neg Hx   . Ovarian cancer Neg Hx   . Thyroid disease Sister   . Hypertension Brother   . Hyperlipidemia Brother   . Stroke Maternal Grandmother     Social History   Social History  . Marital Status: Married    Spouse Name: N/A  . Number of Children: N/A  . Years of Education: N/A   Occupational History  . Not on file.   Social History Main Topics  . Smoking status: Never Smoker   . Smokeless tobacco: Never  Used  . Alcohol Use: No  . Drug Use: No  . Sexual Activity:    Partners: Female    Pharmacist, hospital Protection: Surgical   Other Topics Concern  . Not on file   Social History Narrative     Current outpatient prescriptions:  .  cetirizine (ZYRTEC) 10 MG tablet, Take 10 mg by mouth daily., Disp: , Rfl:  .  dexlansoprazole (DEXILANT) 60 MG capsule, Take 60 mg by mouth daily., Disp: , Rfl:  .  dicyclomine (BENTYL) 10 MG capsule, Take 10 mg by mouth 4 (four) times daily -  before meals and at bedtime., Disp: , Rfl:  .  escitalopram (LEXAPRO) 10 MG tablet, Take 1 tablet (10 mg total) by mouth daily., Disp: 30 tablet, Rfl: 6 .  losartan (COZAAR) 50 MG tablet, Take 1 tablet (50 mg total) by mouth daily., Disp: 90 tablet, Rfl: 0 .  sertraline (ZOLOFT) 50 MG tablet, Take 1 tablet (50 mg total) by mouth daily., Disp: 30 tablet, Rfl: 6  Allergies  Allergen Reactions  . Bactrim [Sulfamethoxazole-Trimethoprim]   . Ciprofloxacin Hcl   . Shellfish Allergy     Review of Systems  Constitutional: Negative for fever,  chills and weight loss.  Eyes: Negative for blurred vision and double vision.  Neurological: Negative for headaches.     Objective  Filed Vitals:   03/25/15 1530  BP: 138/80  Pulse: 86  Temp: 98.6 F (37 C)  TempSrc: Oral  Resp: 18  Height:  (1.651 m)  Weight: 135 lb 3.2 oz (61.326 kg)  SpO2: 100%    Physical Exam  Constitutional: She is oriented to person, place, and time and well-developed, well-nourished, and in no distress.  HENT:  Head: Normocephalic and atraumatic.  Cardiovascular: Normal rate and regular rhythm.   Murmur heard. Pulmonary/Chest: Effort normal and breath sounds normal.  Abdominal: Soft. Bowel sounds are normal.  Musculoskeletal: She exhibits no edema.  Neurological: She is alert and oriented to person, place, and time.  Nursing note and vitals reviewed.   Assessment & Plan  1. Essential hypertension Diastolic blood pressure  markedly improved. Will increase losartan to 100 mg daily and recheck in one month. - losartan (COZAAR) 100 MG tablet; Take 1 tablet (100 mg total) by mouth daily.  Dispense: 30 tablet; Refill: 2   Enyah Moman Asad A[facility] 03/25/2015 4:14 PM

## 2015-04-21 ENCOUNTER — Ambulatory Visit: Payer: Self-pay | Admitting: Cardiovascular Disease

## 2015-04-29 ENCOUNTER — Ambulatory Visit: Payer: Federal, State, Local not specified - PPO | Admitting: Family Medicine

## 2015-04-30 ENCOUNTER — Ambulatory Visit: Payer: Federal, State, Local not specified - PPO | Admitting: Family Medicine

## 2015-05-19 ENCOUNTER — Encounter: Payer: Self-pay | Admitting: Cardiology

## 2015-05-19 ENCOUNTER — Ambulatory Visit (INDEPENDENT_AMBULATORY_CARE_PROVIDER_SITE_OTHER): Payer: Federal, State, Local not specified - PPO | Admitting: Cardiology

## 2015-05-19 ENCOUNTER — Encounter (INDEPENDENT_AMBULATORY_CARE_PROVIDER_SITE_OTHER): Payer: Self-pay

## 2015-05-19 VITALS — BP 100/80 | HR 66 | Ht 65.0 in | Wt 134.0 lb

## 2015-05-19 DIAGNOSIS — R0789 Other chest pain: Secondary | ICD-10-CM | POA: Diagnosis not present

## 2015-05-19 DIAGNOSIS — R011 Cardiac murmur, unspecified: Secondary | ICD-10-CM

## 2015-05-19 DIAGNOSIS — I10 Essential (primary) hypertension: Secondary | ICD-10-CM | POA: Diagnosis not present

## 2015-05-19 DIAGNOSIS — R002 Palpitations: Secondary | ICD-10-CM

## 2015-05-19 NOTE — Patient Instructions (Addendum)
Medication Instructions:  Your physician recommends that you continue on your current medications as directed. Please refer to the Current Medication list given to you today.   Labwork: Your physician recommends that you return for lab work tomorrow morning  Date & Time:__________________________________________________   Testing/Procedures: Your physician has requested that you have an echocardiogram. Echocardiography is a painless test that uses sound waves to create images of your heart. It provides your doctor with information about the size and shape of your heart and how well your heart's chambers and valves are working. This procedure takes approximately one hour. There are no restrictions for this procedure.  Date & Time: ___________________________________________________  Your physician has requested that you have a renal artery duplex. During this test, an ultrasound is used to evaluate blood flow to the kidneys. Allow one hour for this exam. Do not eat after midnight the day before and avoid carbonated beverages. Take your medications as you usually do.  Date & Time: _____________________________________________________  Your physician has recommended that you wear an event monitor. Event monitors are medical devices that record the heart's electrical activity. Doctors most often [place] these monitors to diagnose arrhythmias. Arrhythmias are problems with the speed or rhythm of the heartbeat. The monitor is a small, portable device. You can wear one while you do your normal daily activities. This is usually used to diagnose what is causing palpitations/syncope (passing out).    Follow-Up: Your physician recommends that you schedule a follow-up appointment after testing to review results.  Date & Time: _______________________________________________________  Any Other Special Instructions Will Be Listed Below (If Applicable).     If you need a refill on your cardiac  medications before your next appointment, please call your pharmacy.  Echocardiogram An echocardiogram, or echocardiography, uses sound waves (ultrasound) to produce an image of your heart. The echocardiogram is simple, painless, obtained within a short period of time, and offers valuable information to your health care provider. The images from an echocardiogram can provide information such as:  Evidence of coronary artery disease (CAD).  Heart size.  Heart muscle function.  Heart valve function.  Aneurysm detection.  Evidence of a past heart attack.  Fluid buildup around the heart.  Heart muscle thickening.  Assess heart valve function. LET [facility] CARE PROVIDER KNOW ABOUT:  Any allergies you have.  All medicines you are taking, including vitamins, herbs, eye drops, creams, and over-the-counter medicines.  Previous problems you or members of your family have had with the use of anesthetics.  Any blood disorders you have.  Previous surgeries you have had.  Medical conditions you have.  Possibility of pregnancy, if this applies. BEFORE THE PROCEDURE  No special preparation is needed. Eat and drink normally.  PROCEDURE   In order to produce an image of your heart, gel will be applied to your chest and a wand-like tool (transducer) will be moved over your chest. The gel will help transmit the sound waves from the transducer. The sound waves will harmlessly bounce off your heart to allow the heart images to be captured in real-time motion. These images will then be recorded.  You may need an IV to receive a medicine that improves the quality of the pictures. AFTER THE PROCEDURE You may return to your normal schedule including diet, activities, and medicines, unless your health care provider tells you otherwise.   This information is not intended to replace advice given to you by your health care provider. Make sure you discuss any questions  you have with your health  care provider.   Document Released: 02/20/2000 Document Revised: 03/15/2014 Document Reviewed: 10/30/2012 Elsevier Interactive Patient Education 2016 Elsevier Inc.    Cardiac Event Monitoring A cardiac event monitor is a small recording device used to help detect abnormal heart rhythms (arrhythmias). The monitor is used to record heart rhythm when noticeable symptoms such as the following occur:  Fast heartbeats (palpitations), such as heart racing or fluttering.  Dizziness.  Fainting or light-headedness.  Unexplained weakness. The monitor is wired to two electrodes placed on your chest. Electrodes are flat, sticky disks that attach to your skin. The monitor can be worn for up to 30 days. You will wear the monitor at all times, except when bathing.  HOW TO USE YOUR CARDIAC EVENT MONITOR A technician will prepare your chest for the electrode placement. The technician will show you how to place the electrodes, how to work the monitor, and how to replace the batteries. Take time to practice using the monitor before you leave the office. Make sure you understand how to send the information from the monitor to your health care provider. This requires a telephone with a landline, not a cell phone. You need to:  Wear your monitor at all times, except when you are in water:  Do not get the monitor wet.  Take the monitor off when bathing. Do not swim or use a hot tub with it on.  Keep your skin clean. Do not put body lotion or moisturizer on your chest.  Change the electrodes daily or any time they stop sticking to your skin. You might need to use tape to keep them on.  It is possible that your skin under the electrodes could become irritated. To keep this from happening, try to put the electrodes in slightly different places on your chest. However, they must remain in the area under your left breast and in the upper right section of your chest.  Make sure the monitor is safely clipped to  your clothing or in a location close to your body that your health care provider recommends.  Press the button to record when you feel symptoms of heart trouble, such as dizziness, weakness, light-headedness, palpitations, thumping, shortness of breath, unexplained weakness, or a fluttering or racing heart. The monitor is always on and records what happened slightly before you pressed the button, so do not worry about being too late to get good information.  Keep a diary of your activities, such as walking, doing chores, and taking medicine. It is especially important to note what you were doing when you pushed the button to record your symptoms. This will help your health care provider determine what might be contributing to your symptoms. The information stored in your monitor will be reviewed by your health care provider alongside your diary entries.  Send the recorded information as recommended by your health care provider. It is important to understand that it will take some time for your health care provider to process the results.  Change the batteries as recommended by your health care provider. SEEK IMMEDIATE MEDICAL CARE IF:   You have chest pain.  You have extreme difficulty breathing or shortness of breath.  You develop a very fast heartbeat that persists.  You develop dizziness that does not go away.  You faint or constantly feel you are about to faint.   This information is not intended to replace advice given to you by your health care provider. Make sure you  discuss any questions you have with your health care provider.   Document Released: 12/02/2007 Document Revised: 03/15/2014 Document Reviewed: 08/21/2012 Elsevier Interactive Patient Education [employer]2016 Elsevier Inc.

## 2015-05-19 NOTE — Progress Notes (Signed)
Cardiology Office Note   Date:  05/19/2015   ID:  Savannah Richards, DOB [birth year], MRN [id number]  Referring Doctor:  Brayton ElSyed Shah, MD   Cardiologist:   Almond LintAileen Christain Niznik, MD   Reason for consultation:  Chief Complaint  Patient presents with  . other    Heart murmur no complaints today . Meds reviewed verbally with pt.      History of Present Illness: Savannah Richards is a 40 y.o. female who presents for evaluation of heart murmur. Patient was told that she's had this since she was a kid. She does not recall having an echocardiogram in the past. The heart murmur was not heard much during most part of her childhood but it was surfaced during pregnancy.  Patient complains of palpitations. This is been going on for many months now, more frequent over the years. Describes as heart racing, symptoms meaning the chest, nonradiating, moderate in intensity, lasting minutes at a time, the most was 15 minutes the last time. No known precipitating or palliating factors. Resolves spontaneously. Occurs maybe once a month or once every other month. No loss of consciousness. No associated nausea or diaphoresis. Denies shortness of breath.  Patient has occasional chest pain. This been going on for many years now, rare in occurrence. Describes a sharp stabbing pain in the center of the chest, nonradiating, mild in intensity, lasting a second or so. Not related to exertion. Resolves spontaneously. Not really bothersome to her. No associated shortness of breath. She is able to jog around the track or rind for 30 minutes at a time, maybe 3 times a week and she will not have any symptoms during this exercise.  In terms of hypertension, she's been diagnosed with this since in her early 30s. Her losartan dose is been increased recently. She has significant family history of hypertension in the family. She is unaware if the people in the family started young with hypertension.   No fever, cough, colds, or  jaw pain, orthopnea, PND, edema, bleeding, abdominal pain.  ROS:  Please see the history of present illness. Aside from mentioned under HPI, all other systems are reviewed and negative.     Past Medical History  Diagnosis Date  . IBS (irritable bowel syndrome)   . Infertility, female   . GERD (gastroesophageal reflux disease)   . Elevated lipids   . Elevated hemoglobin (HCC)   . Endometriosis   . Dyspareunia   . Irregular periods   . Ovarian cyst   . Vaginal Pap smear, abnormal   . Urinary urgency   . Pelvic adhesions   . Vaginal discharge   . Hematuria   . Anxiety   . Hypertension     Past Surgical History  Procedure Laterality Date  . Mole removal      multiple  . Cesarean section      x 2  . Hernia repair      umbilicus  . Vaginal hysterectomy  10/2011     reports that she has never smoked. She has never used smokeless tobacco. She reports that she does not drink alcohol or use illicit drugs.  She has 3 boys 10, 7, 5.  family history includes Colon cancer in her paternal grandmother; Diabetes in her father, maternal grandfather, and paternal grandfather; Endometriosis in her mother; Heart disease in her father; Hyperlipidemia in her brother; Hypertension in her brother, father, and mother; Osteoporosis in her mother; Stroke in her maternal grandmother; Thyroid disease in her sister.  There is no history of Breast cancer or Ovarian cancer.  Mother had hypertension. Maternal uncle had MI at age 74. Another uncle had CABG in his 23s. Maternal grandmother had stroke  Current Outpatient Prescriptions  Medication Sig Dispense Refill  . cetirizine (ZYRTEC) 10 MG tablet Take 10 mg by mouth as needed.     . dicyclomine (BENTYL) 10 MG capsule Take 10 mg by mouth as needed.     Marland Kitchen escitalopram (LEXAPRO) 10 MG tablet Take 1 tablet (10 mg total) by mouth daily. 30 tablet 6  . losartan (COZAAR) 100 MG tablet Take 1 tablet (100 mg total) by mouth daily. 30 tablet 2   No current  facility-administered medications for this visit.    Allergies: Bactrim; Ciprofloxacin hcl; and Shellfish allergy    PHYSICAL EXAM: VS:  BP 100/80 mmHg  Pulse 66  Ht  (1.651 m)  Wt 134 lb (60.782 kg)  BMI 22.30 kg/m2 , Body mass index is 22.3 kg/(m^2). Wt Readings from Last 3 Encounters:  05/19/15 134 lb (60.782 kg)  03/25/15 135 lb 3.2 oz (61.326 kg)  02/17/15 132 lb 6.4 oz (60.056 kg)    GENERAL:  well developed, well nourished, not in acute distress HEENT: normocephalic, pink conjunctivae, anicteric sclerae, no xanthelasma, normal dentition, oropharynx clear NECK:  no neck vein engorgement, JVP normal, no hepatojugular reflux, carotid upstroke brisk and symmetric, no bruit, no thyromegaly, no lymphadenopathy LUNGS:  good respiratory effort, clear to auscultation bilaterally CV:  PMI not displaced, no thrills, no lifts, S1 and S2 within normal limits, no palpable S3 or S4, no rubs, no gallops, soft systolic murmur heard best at the base, left parasternal,nonradiating, regular rhythm ABD:  Soft, nontender, nondistended, normoactive bowel sounds, no abdominal aortic bruit, no hepatomegaly, no splenomegaly MS: nontender back, no kyphosis, no scoliosis, no joint deformities EXT:  2+ DP/PT pulses, no edema, no varicosities, no cyanosis, no clubbing SKIN: warm, nondiaphoretic, normal turgor, no ulcers NEUROPSYCH: alert, oriented to person, place, and time, sensory/motor grossly intact, normal mood, appropriate affect  Recent Labs: 01/22/2015: ALT 11; BUN 16; Creatinine, Ser 0.63; Potassium 4.2; Sodium 138; TSH 2.090   Lipid Panel    Component Value Date/Time   CHOL 219* 01/22/2015 0857   TRIG 80 01/22/2015 0857   HDL 48 01/22/2015 0857   CHOLHDL 4.6* 01/22/2015 0857   LDLCALC 155* 01/22/2015 0857     Other studies Reviewed:  EKG:  EKG is ordered today. 05/19/2015  The ekg ordered today was personally reviewed by me and it reveals Sinus rhythm, 66 BPM, sinus arrhythmia,  nonspecific T-wave changes.  Additional studies/ records that were reviewed personally reviewed by me today include:  None available  ASSESSMENT AND PLAN:  Systolic murmur Check echocardiogram  Palpitations Since symptoms do not occur on a daily basis, recommend event monitor.  Hypertension Patient reports highest blood pressure is 160/105. Her PCP is been aggressive with controlling her high blood pressure. Recommend evaluation for secondary causes of hypertension to evaluate for pheochromocytoma, hyperaldosteronism, renal artery stenosis. Will order for plasma fractionated metanephrines, PRA, thyroid function tests,renal artery duplex ultrasound.  Chest pain Very atypical. She does not have any symptoms with exertion. We'll continue to monitor. I suppose with her significant family history of coronary artery disease, will end up with a stress testing at some point.  Current medicines are reviewed at length with the patient today.  The patient does not have concerns regarding medicines.  Labs/ tests ordered today include:  Orders Placed This  Encounter  Procedures  . Metanephrines, plasma  . Aldosterone + renin activity w/ ratio  . TSH  . T4, free  . Cardiac event monitor  . EKG 12-Lead  . Echocardiogram    I had a lengthy and detailed discussion with the patient regarding diagnoses, prognosis, diagnostic options, treatment options, and side effects of medications.   I counseled the patient on importance of lifestyle modification including heart healthy diet, regular physical activity.  Disposition:   FU with undersigned after tests   Signed, Almond Lint, MD  05/19/2015 4:01 PM    [facility]

## 2015-05-20 ENCOUNTER — Other Ambulatory Visit: Payer: Federal, State, Local not specified - PPO

## 2015-05-21 ENCOUNTER — Ambulatory Visit (INDEPENDENT_AMBULATORY_CARE_PROVIDER_SITE_OTHER): Payer: Federal, State, Local not specified - PPO

## 2015-05-21 DIAGNOSIS — R002 Palpitations: Secondary | ICD-10-CM

## 2015-05-21 DIAGNOSIS — R011 Cardiac murmur, unspecified: Secondary | ICD-10-CM

## 2015-05-28 ENCOUNTER — Encounter: Payer: Self-pay | Admitting: Family Medicine

## 2015-05-28 ENCOUNTER — Ambulatory Visit (INDEPENDENT_AMBULATORY_CARE_PROVIDER_SITE_OTHER): Payer: Federal, State, Local not specified - PPO | Admitting: Family Medicine

## 2015-05-28 VITALS — BP 110/68 | HR 84 | Temp 98.0°F | Resp 18 | Ht 65.0 in | Wt 132.6 lb

## 2015-05-28 DIAGNOSIS — I10 Essential (primary) hypertension: Secondary | ICD-10-CM

## 2015-05-28 DIAGNOSIS — E559 Vitamin D deficiency, unspecified: Secondary | ICD-10-CM | POA: Diagnosis not present

## 2015-05-28 DIAGNOSIS — E785 Hyperlipidemia, unspecified: Secondary | ICD-10-CM

## 2015-05-28 DIAGNOSIS — R7303 Prediabetes: Secondary | ICD-10-CM | POA: Diagnosis not present

## 2015-05-28 LAB — POCT GLYCOSYLATED HEMOGLOBIN (HGB A1C): Hemoglobin A1C: 5.6

## 2015-05-28 MED ORDER — LOSARTAN POTASSIUM 100 MG PO TABS: 100.0000 mg | ORAL_TABLET | Freq: Every day | ORAL | Status: DC

## 2015-05-28 NOTE — Progress Notes (Signed)
Name: Savannah Richards   MRN: [id number]    DOB: 05/26/1975   Date:05/28/2015       Progress Note  Subjective  Chief Complaint  Chief Complaint  Patient presents with  . Hypertension    pt here for 1 month follow up and lab work    HPI  Hypertension: Blood Pressure is well-controlled. She denies any symptoms of light-headedness and dizziness. Had had palpitations and is being followed by Cardiology (wearing a Holter monitor). No side effects from Losartan.  Hyperlipidemia: Elevated Total and LDL cholesterol drawn by Dr. Greggory Richards in November 2016. Not on pharmacotherapy. Tries to exercise 3x/week (yoga, walking, and running).     Past Medical History  Diagnosis Date  . IBS (irritable bowel syndrome)   . Infertility, female   . GERD (gastroesophageal reflux disease)   . Elevated lipids   . Elevated hemoglobin (HCC)   . Endometriosis   . Dyspareunia   . Irregular periods   . Ovarian cyst   . Vaginal Pap smear, abnormal   . Urinary urgency   . Pelvic adhesions   . Vaginal discharge   . Hematuria   . Anxiety   . Hypertension     Past Surgical History  Procedure Laterality Date  . Mole removal      multiple  . Cesarean section      x 2  . Hernia repair      umbilicus  . Vaginal hysterectomy  10/2011    Family History  Problem Relation Age of Onset  . Osteoporosis Mother   . Endometriosis Mother   . Hypertension Mother   . Diabetes Father   . Heart disease Father   . Hypertension Father   . Diabetes Maternal Grandfather   . Colon cancer Paternal Grandmother   . Diabetes Paternal Grandfather   . Breast cancer Neg Hx   . Ovarian cancer Neg Hx   . Thyroid disease Sister   . Hypertension Brother   . Hyperlipidemia Brother   . Stroke Maternal Grandmother     Social History   Social History  . Marital Status: Married    Spouse Name: N/A  . Number of Children: N/A  . Years of Education: N/A   Occupational History  . Not on file.   Social  History Main Topics  . Smoking status: Never Smoker   . Smokeless tobacco: Never Used  . Alcohol Use: No  . Drug Use: No  . Sexual Activity:    Partners: Female    Pharmacist, hospital Protection: Surgical   Other Topics Concern  . Not on file   Social History Narrative     Current outpatient prescriptions:  .  cetirizine (ZYRTEC) 10 MG tablet, Take 10 mg by mouth as needed. , Disp: , Rfl:  .  dicyclomine (BENTYL) 10 MG capsule, Take 10 mg by mouth as needed. , Disp: , Rfl:  .  escitalopram (LEXAPRO) 10 MG tablet, Take 1 tablet (10 mg total) by mouth daily., Disp: 30 tablet, Rfl: 6 .  losartan (COZAAR) 100 MG tablet, Take 1 tablet (100 mg total) by mouth daily., Disp: 30 tablet, Rfl: 2  Allergies  Allergen Reactions  . Bactrim [Sulfamethoxazole-Trimethoprim]   . Ciprofloxacin Hcl   . Shellfish Allergy      Review of Systems  Constitutional: Negative for fever and chills.  Eyes: Negative for blurred vision and double vision.  Respiratory: Negative for shortness of breath.   Cardiovascular: Positive for palpitations. Negative for chest pain.  Gastrointestinal: Negative for abdominal pain.     Objective  Filed Vitals:   05/28/15 0840  BP: 110/68  Pulse: 84  Temp: 98 F (36.7 C)  Resp: 18  Height: 5\' 5"  (1.651 m)  Weight: 132 lb 9 oz (60.13 kg)  SpO2: 98%    Physical Exam  Constitutional: She is oriented to person, place, and time and well-developed, well-nourished, and in no distress.  HENT:  Head: Normocephalic and atraumatic.  Cardiovascular: Normal rate and regular rhythm.   Murmur heard.  Systolic murmur is present with a grade of 2/6  Pulmonary/Chest: Effort normal and breath sounds normal.  Musculoskeletal:       Right ankle: She exhibits no swelling.       Left ankle: She exhibits no swelling.  Neurological: She is alert and oriented to person, place, and time.  Psychiatric: Mood, memory, affect and judgment normal.  Nursing note and vitals  reviewed.      Assessment & Plan  1. Essential hypertension Blood pressure well controlled on losartan, no side effects. - losartan (COZAAR) 100 MG tablet; Take 1 tablet (100 mg total) by mouth daily.  Dispense: 30 tablet; Refill: 2  2. Hyperlipidemia Recheck and consider starting statin therapy. - Lipid Profile  3. Prediabetes  - POCT HgB A1C  4. Vitamin D insufficiency  - Vitamin D (25 hydroxy)   Savannah Richards Asad Savannah Richards [facility] 05/28/2015 8:46 AM

## 2015-05-29 LAB — VITAMIN D 25 HYDROXY (VIT D DEFICIENCY, FRACTURES): Vit D, 25-Hydroxy: 30.1 ng/mL (ref 30.0–100.0)

## 2015-05-29 LAB — LIPID PANEL
CHOL/HDL RATIO: 4 ratio (ref 0.0–4.4)
Cholesterol, Total: 209 mg/dL — ABNORMAL HIGH (ref 100–199)
HDL: 52 mg/dL (ref >39)
LDL Calculated: 140 mg/dL — ABNORMAL HIGH (ref 0–99)
TRIGLYCERIDES: 87 mg/dL (ref 0–149)
VLDL Cholesterol Cal: 17 mg/dL (ref 5–40)

## 2015-06-11 ENCOUNTER — Ambulatory Visit (INDEPENDENT_AMBULATORY_CARE_PROVIDER_SITE_OTHER): Payer: Federal, State, Local not specified - PPO

## 2015-06-11 ENCOUNTER — Other Ambulatory Visit: Payer: Self-pay

## 2015-06-11 ENCOUNTER — Ambulatory Visit: Payer: Federal, State, Local not specified - PPO

## 2015-06-11 ENCOUNTER — Telehealth: Payer: Self-pay | Admitting: Family Medicine

## 2015-06-11 DIAGNOSIS — R002 Palpitations: Secondary | ICD-10-CM | POA: Diagnosis not present

## 2015-06-11 DIAGNOSIS — R011 Cardiac murmur, unspecified: Secondary | ICD-10-CM | POA: Diagnosis not present

## 2015-06-11 DIAGNOSIS — I10 Essential (primary) hypertension: Secondary | ICD-10-CM | POA: Diagnosis not present

## 2015-06-11 NOTE — Telephone Encounter (Signed)
Lab results have been reported to patient  

## 2015-06-11 NOTE — Telephone Encounter (Signed)
Pt would like to know if her labs have came back. Please advise.

## 2015-07-08 ENCOUNTER — Encounter (INDEPENDENT_AMBULATORY_CARE_PROVIDER_SITE_OTHER): Payer: Self-pay

## 2015-07-08 ENCOUNTER — Encounter: Payer: Self-pay | Admitting: Cardiology

## 2015-07-08 ENCOUNTER — Telehealth: Payer: Self-pay | Admitting: Cardiology

## 2015-07-08 ENCOUNTER — Ambulatory Visit (INDEPENDENT_AMBULATORY_CARE_PROVIDER_SITE_OTHER): Payer: Federal, State, Local not specified - PPO | Admitting: Cardiology

## 2015-07-08 VITALS — BP 130/80 | HR 71 | Ht 65.0 in | Wt 136.0 lb

## 2015-07-08 DIAGNOSIS — I10 Essential (primary) hypertension: Secondary | ICD-10-CM

## 2015-07-08 DIAGNOSIS — R002 Palpitations: Secondary | ICD-10-CM | POA: Diagnosis not present

## 2015-07-08 DIAGNOSIS — R011 Cardiac murmur, unspecified: Secondary | ICD-10-CM

## 2015-07-08 NOTE — Progress Notes (Signed)
Cardiology Office Note   Date:  07/08/2015   ID:  Savannah Richards, DOB 1975-11-23, MRN [id number]  Referring Doctor:  Brayton ElSyed Shah, MD   Cardiologist:   Almond LintAileen Claudell Wohler, MD   Reason for consultation:  Chief Complaint  Patient presents with  . other    F/u holter and echo. Meds reviewed verbally with pt.      History of Present Illness: Savannah Richards is a 40 y.o. female who presents for Follow-up after tests.  No fever, cough, colds, or jaw pain, orthopnea, PND, edema, bleeding, abdominal pain.  ROS:  Please see the history of present illness. Aside from mentioned under HPI, all other systems are reviewed and negative.     Past Medical History  Diagnosis Date  . IBS (irritable bowel syndrome)   . Infertility, female   . GERD (gastroesophageal reflux disease)   . Elevated lipids   . Elevated hemoglobin (HCC)   . Endometriosis   . Dyspareunia   . Irregular periods   . Ovarian cyst   . Vaginal Pap smear, abnormal   . Urinary urgency   . Pelvic adhesions   . Vaginal discharge   . Hematuria   . Anxiety   . Hypertension     Past Surgical History  Procedure Laterality Date  . Mole removal      multiple  . Cesarean section      x 2  . Hernia repair      umbilicus  . Vaginal hysterectomy  10/2011     reports that she has never smoked. She has never used smokeless tobacco. She reports that she does not drink alcohol or use illicit drugs.  She has 3 boys 10, 7, 5.  family history includes Colon cancer in her paternal grandmother; Diabetes in her father, maternal grandfather, and paternal grandfather; Endometriosis in her mother; Heart disease in her father; Hyperlipidemia in her brother; Hypertension in her brother, father, and mother; Osteoporosis in her mother; Stroke in her maternal grandmother; Thyroid disease in her sister. There is no history of Breast cancer or Ovarian cancer.  Mother had hypertension. Maternal uncle had MI at age 38. Another uncle  had CABG in his 5040s. Maternal grandmother had stroke  Current Outpatient Prescriptions  Medication Sig Dispense Refill  . cetirizine (ZYRTEC) 10 MG tablet Take 10 mg by mouth as needed.     . dicyclomine (BENTYL) 10 MG capsule Take 10 mg by mouth as needed.     Marland Kitchen. escitalopram (LEXAPRO) 10 MG tablet Take 1 tablet (10 mg total) by mouth daily. 30 tablet 6  . losartan (COZAAR) 100 MG tablet Take 1 tablet (100 mg total) by mouth daily. 30 tablet 2   No current facility-administered medications for this visit.    Allergies: Bactrim; Ciprofloxacin hcl; and Shellfish allergy    PHYSICAL EXAM: VS:  BP 130/80 mmHg  Pulse 71  Ht 5\' 5"  (1.651 m)  Wt 136 lb (61.689 kg)  BMI 22.63 kg/m2 , Body mass index is 22.63 kg/(m^2). Wt Readings from Last 3 Encounters:  07/08/15 136 lb (61.689 kg)  05/28/15 132 lb 9 oz (60.13 kg)  05/19/15 134 lb (60.782 kg)    GENERAL:  well developed, well nourished, not in acute distress HEENT: normocephalic, pink conjunctivae, anicteric sclerae, no xanthelasma, normal dentition, oropharynx clear NECK:  no neck vein engorgement, JVP normal, no hepatojugular reflux, carotid upstroke brisk and symmetric, no bruit, no thyromegaly, no lymphadenopathy LUNGS:  good respiratory effort, clear to auscultation  bilaterally CV:  PMI not displaced, no thrills, no lifts, S1 and S2 within normal limits, no palpable S3 or S4, no rubs, no gallops, soft systolic murmur heard best at the base, left parasternal,nonradiating, regular rhythm ABD:  Soft, nontender, nondistended, normoactive bowel sounds, no abdominal aortic bruit, no hepatomegaly, no splenomegaly MS: nontender back, no kyphosis, no scoliosis, no joint deformities EXT:  2+ DP/PT pulses, no edema, no varicosities, no cyanosis, no clubbing SKIN: warm, nondiaphoretic, normal turgor, no ulcers NEUROPSYCH: alert, oriented to person, place, and time, sensory/motor grossly intact, normal mood, appropriate affect  Recent  Labs: 01/22/2015: ALT 11; BUN 16; Creatinine, Ser 0.63; Potassium 4.2; Sodium 138; TSH 2.090   Lipid Panel    Component Value Date/Time   CHOL 209* 05/28/2015 0914   TRIG 87 05/28/2015 0914   HDL 52 05/28/2015 0914   CHOLHDL 4.0 05/28/2015 0914   LDLCALC 140* 05/28/2015 0914     Other studies Reviewed:  EKG:   The ekg from 05/19/2015 was personally reviewed by me and it reveals Sinus rhythm, 66 BPM, sinus arrhythmia, nonspecific T-wave changes.  Additional studies/ records that were reviewed personally reviewed by me today include:  Echo 06/11/2015: Left ventricle: The cavity size was normal. Wall thickness was  normal. Systolic function was normal. The estimated ejection  fraction was in the range of 50% to 55%. Wall motion was normal;  there were no regional wall motion abnormalities. Left  ventricular diastolic function parameters were normal. - Tricuspid valve: There was mild regurgitation.  Event monitor 05/21/2015:  Sinus rhythm  Events corresponded to sinus rhythm  Monitoring period was 05/21/2015 to 06/19/2015.  Overall rhythm was sinus. Baseline sample showed sinus rhythm at 86 BPM.  Automatically detected events showed sinus rhythm, and sinus tachycardia.  Manually detected events: Rapid or fast heartbeat - sinus rhythm Rapid or fast heartbeat - sinus rhythm with artifact Chest pain or pressure - sinus rhythm with artifact Chest pain or pressure - sinus rhythm  Renal artery ultrasound 06/11/2015: Normal caliber abdominal aorta. Normal and symmetrical kidney size. Normal renal arteries, bilaterally. The IVC and renal veins are patent.  ASSESSMENT AND PLAN:  Systolic murmur Likely flow murmur. No significant valve problems on echo cardiac exam. Patient reassured.  Palpitations Event monitor showed that her symptoms correlated to sinus rhythm. No significant abnormality. Patient reassured.  Hypertension Blood pressure improved today. Awaiting  results of blood work done to workup for secondary causes of hypertension to evaluate for pheochromocytoma, hyperaldosteronism thyroid function tests,renal artery duplex ultrasound were unremarkable. Need to follow-up plasma fractionated metanephrines, PRA/PAC  Chest pain Very atypical. Likely musculoskeletal. She hasn't had any episodes in a while. Continue to monitor.   Current medicines are reviewed at length with the patient today.  The patient does not have concerns regarding medicines.  Labs/ tests ordered today include:  No orders of the defined types were placed in this encounter.    I had a lengthy and detailed discussion with the patient regarding diagnoses, prognosis, diagnostic options, treatment options, and side effects of medications.   I counseled the patient on importance of lifestyle modification including heart healthy diet, regular physical activity.  Disposition:   FU with undersigned when necessary   Signed, Almond Lint, MD  07/08/2015 3:23 PM    [facility]

## 2015-07-08 NOTE — Patient Instructions (Signed)
Medication Instructions:  Your physician recommends that you continue on your current medications as directed. Please refer to the Current Medication list given to you today.  Labwork: None ordered.  Testing/Procedures: None ordered.  Follow-Up: Your physician recommends that you schedule a follow-up appointment as needed.   Any Other Special Instructions Will Be Listed Below (If Applicable).     If you need a refill on your cardiac medications before your next appointment, please call your pharmacy.   

## 2015-07-08 NOTE — Telephone Encounter (Signed)
Patient had just left office and [facility] Results were within normal limits and left detailed voicemail message for patient with results and instructions to call back if she has any further questions.

## 2015-07-15 ENCOUNTER — Ambulatory Visit: Payer: Federal, State, Local not specified - PPO | Admitting: Cardiology

## 2015-08-19 ENCOUNTER — Other Ambulatory Visit: Payer: Self-pay | Admitting: Family Medicine

## 2015-08-20 ENCOUNTER — Other Ambulatory Visit: Payer: Self-pay

## 2015-08-20 MED ORDER — ESCITALOPRAM OXALATE 10 MG PO TABS: 10.0000 mg | ORAL_TABLET | Freq: Every day | ORAL | Status: DC

## 2015-08-29 ENCOUNTER — Other Ambulatory Visit: Payer: Self-pay | Admitting: Family Medicine

## 2015-08-29 ENCOUNTER — Ambulatory Visit
Admission: RE | Admit: 2015-08-29 | Discharge: 2015-08-29 | Disposition: A | Discharge status: Home / Self Care | Payer: Federal, State, Local not specified - PPO | Source: Ambulatory Visit | Attending: Family Medicine | Admitting: Family Medicine

## 2015-08-29 ENCOUNTER — Encounter: Payer: Self-pay | Admitting: Family Medicine

## 2015-08-29 ENCOUNTER — Ambulatory Visit (INDEPENDENT_AMBULATORY_CARE_PROVIDER_SITE_OTHER): Payer: Federal, State, Local not specified - PPO | Admitting: Family Medicine

## 2015-08-29 VITALS — BP 127/68 | HR 79 | Temp 98.8°F | Resp 15 | Ht 65.0 in | Wt 137.5 lb

## 2015-08-29 DIAGNOSIS — S99922A Unspecified injury of left foot, initial encounter: Secondary | ICD-10-CM

## 2015-08-29 DIAGNOSIS — I10 Essential (primary) hypertension: Secondary | ICD-10-CM | POA: Diagnosis not present

## 2015-08-29 DIAGNOSIS — S92535A Nondisplaced fracture of distal phalanx of left lesser toe(s), initial encounter for closed fracture: Secondary | ICD-10-CM | POA: Diagnosis not present

## 2015-08-29 DIAGNOSIS — X58XXXA Exposure to other specified factors, initial encounter: Secondary | ICD-10-CM | POA: Insufficient documentation

## 2015-08-29 DIAGNOSIS — E785 Hyperlipidemia, unspecified: Secondary | ICD-10-CM

## 2015-08-29 DIAGNOSIS — S92912A Unspecified fracture of left toe(s), initial encounter for closed fracture: Secondary | ICD-10-CM | POA: Insufficient documentation

## 2015-08-29 MED ORDER — LOSARTAN POTASSIUM 100 MG PO TABS: 100.0000 mg | ORAL_TABLET | Freq: Every day | ORAL | Status: DC

## 2015-08-29 NOTE — Progress Notes (Signed)
Name: Savannah GrimmerCrystal Hagood Richards   MRN: [id number]    DOB: October 28, 1975   Date:08/29/2015       Progress Note  Subjective  Chief Complaint  Chief Complaint  Patient presents with  . Follow-up    3 mo  . Medication Refill    losartan 100 mg     Hypertension This is a chronic problem. The problem is controlled. Pertinent negatives include no blurred vision, chest pain, headaches or palpitations. Risk factors for coronary artery disease include dyslipidemia. Past treatments include angiotensin blockers. There is no history of kidney disease, CAD/MI or CVA.  Hyperlipidemia This is a chronic problem. The problem is uncontrolled. Recent lipid tests were reviewed and are high. Pertinent negatives include no chest pain. Current antihyperlipidemic treatment includes diet change.  Toe Pain  The incident occurred more than 1 week ago. The incident occurred at the gym. Injury mechanism: WHile doing jujitsu, her left 5th toe was twisted outwards, felt severe pain, had it taped up for first week. The pain is present in the left toes. The quality of the pain is described as shooting. The pain has been improving since onset. Associated symptoms include numbness (upon waking up in the morning.). Pertinent negatives include no inability to bear weight or tingling. She has tried non-weight bearing for the symptoms. The treatment provided moderate relief.     Past Medical History  Diagnosis Date  . IBS (irritable bowel syndrome)   . Infertility, female   . GERD (gastroesophageal reflux disease)   . Elevated lipids   . Elevated hemoglobin (HCC)   . Endometriosis   . Dyspareunia   . Irregular periods   . Ovarian cyst   . Vaginal Pap smear, abnormal   . Urinary urgency   . Pelvic adhesions   . Vaginal discharge   . Hematuria   . Anxiety   . Hypertension     Past Surgical History  Procedure Laterality Date  . Mole removal      multiple  . Cesarean section      x 2  . Hernia repair      umbilicus    . Vaginal hysterectomy  10/2011    Family History  Problem Relation Age of Onset  . Osteoporosis Mother   . Endometriosis Mother   . Hypertension Mother   . Diabetes Father   . Heart disease Father   . Hypertension Father   . Diabetes Maternal Grandfather   . Colon cancer Paternal Grandmother   . Diabetes Paternal Grandfather   . Breast cancer Neg Hx   . Ovarian cancer Neg Hx   . Thyroid disease Sister   . Hypertension Brother   . Hyperlipidemia Brother   . Stroke Maternal Grandmother     Social History   Social History  . Marital Status: Married    Spouse Name: N/A  . Number of Children: N/A  . Years of Education: N/A   Occupational History  . Not on file.   Social History Main Topics  . Smoking status: Never Smoker   . Smokeless tobacco: Never Used  . Alcohol Use: No  . Drug Use: No  . Sexual Activity:    Partners: Female    Pharmacist, hospitalBirth Control/ Protection: Surgical   Other Topics Concern  . Not on file   Social History Narrative     Current outpatient prescriptions:  .  cetirizine (ZYRTEC) 10 MG tablet, Take 10 mg by mouth as needed. , Disp: , Rfl:  .  dicyclomine (BENTYL)  10 MG capsule, Take 10 mg by mouth as needed. , Disp: , Rfl:  .  escitalopram (LEXAPRO) 10 MG tablet, Take 1 tablet (10 mg total) by mouth daily., Disp: 30 tablet, Rfl: 6 .  losartan (COZAAR) 100 MG tablet, Take 1 tablet (100 mg total) by mouth daily., Disp: 30 tablet, Rfl: 2  Allergies  Allergen Reactions  . Bactrim [Sulfamethoxazole-Trimethoprim]   . Ciprofloxacin Hcl   . Shellfish Allergy      Review of Systems  Eyes: Negative for blurred vision.  Cardiovascular: Negative for chest pain and palpitations.  Musculoskeletal: Positive for joint pain.  Neurological: Positive for numbness (upon waking up in the morning.). Negative for tingling and headaches.    Objective  Filed Vitals:   08/29/15 0849  BP: 127/68  Pulse: 79  Temp: 98.8 F (37.1 C)  TempSrc: Oral  Resp: 15   Height: 5\' 5"  (1.651 m)  Weight: 137 lb 8 oz (62.37 kg)  SpO2: 99%    Physical Exam  Constitutional: She is oriented to person, place, and time and well-developed, well-nourished, and in no distress.  Cardiovascular: Normal rate, regular rhythm, S1 normal and S2 normal.   Murmur heard.  Systolic murmur is present with a grade of 2/6  Pulmonary/Chest: Effort normal and breath sounds normal. She has no wheezes. She has no rhonchi.  Abdominal: Soft. Bowel sounds are normal. There is no tenderness.  Musculoskeletal:       Left foot: There is tenderness and swelling.       Feet:  Marked tenderness to palpation over the left 5th toe, associated edema, normal ROM against resistance.   Neurological: She is alert and oriented to person, place, and time.  Nursing note and vitals reviewed.      Assessment & Plan  1. Hyperlipidemia Patient has been working on dietary therapy, repeat FLP in September 2070  2. Essential hypertension Blood pressure stable and controlled on losartan. Refills provided - losartan (COZAAR) 100 MG tablet; Take 1 tablet (100 mg total) by mouth daily.  Dispense: 90 tablet; Refill: 1  3. Injury of toe on left foot, initial encounter Obtain x-rays for evaluation of twisting injury of the left fifth toe. - DG Foot Complete Left; Future  Catharine Kettlewell Asad A. Faylene KurtzShah [facility] 08/29/2015 8:58 AM

## 2015-09-05 ENCOUNTER — Ambulatory Visit: Payer: Self-pay | Admitting: Sports Medicine

## 2015-12-02 ENCOUNTER — Ambulatory Visit: Payer: Federal, State, Local not specified - PPO | Admitting: Family Medicine

## 2015-12-16 DIAGNOSIS — H60501 Unspecified acute noninfective otitis externa, right ear: Secondary | ICD-10-CM | POA: Diagnosis not present

## 2015-12-16 DIAGNOSIS — J029 Acute pharyngitis, unspecified: Secondary | ICD-10-CM | POA: Diagnosis not present

## 2015-12-18 ENCOUNTER — Other Ambulatory Visit: Payer: Self-pay | Admitting: Family Medicine

## 2015-12-18 DIAGNOSIS — I10 Essential (primary) hypertension: Secondary | ICD-10-CM

## 2016-01-05 DIAGNOSIS — Z1283 Encounter for screening for malignant neoplasm of skin: Secondary | ICD-10-CM | POA: Diagnosis not present

## 2016-01-05 DIAGNOSIS — L728 Other follicular cysts of the skin and subcutaneous tissue: Secondary | ICD-10-CM | POA: Diagnosis not present

## 2016-01-05 DIAGNOSIS — Z872 Personal history of diseases of the skin and subcutaneous tissue: Secondary | ICD-10-CM | POA: Diagnosis not present

## 2016-01-08 ENCOUNTER — Ambulatory Visit: Payer: Federal, State, Local not specified - PPO | Admitting: Family Medicine

## 2016-03-05 DIAGNOSIS — H10412 Chronic giant papillary conjunctivitis, left eye: Secondary | ICD-10-CM | POA: Diagnosis not present

## 2016-03-20 ENCOUNTER — Other Ambulatory Visit: Payer: Self-pay | Admitting: Family Medicine

## 2016-03-20 DIAGNOSIS — I10 Essential (primary) hypertension: Secondary | ICD-10-CM

## 2016-03-26 DIAGNOSIS — M545 Low back pain: Secondary | ICD-10-CM | POA: Diagnosis not present

## 2016-03-31 DIAGNOSIS — M5416 Radiculopathy, lumbar region: Secondary | ICD-10-CM | POA: Diagnosis not present

## 2016-04-01 ENCOUNTER — Telehealth: Payer: Self-pay | Admitting: Obstetrics and Gynecology

## 2016-04-01 NOTE — Telephone Encounter (Signed)
PT CALLED AND STATED SHE HAS HAD PHARMACY SEND IN LEXAPRO REQ 3 TIMES TO US AND SHE DOES NOT HAVE ANYTHING AT THE PHARMACY.

## 2016-04-02 MED ORDER — ESCITALOPRAM OXALATE 10 MG PO TABS: 10.0000 mg | ORAL_TABLET | Freq: Every day | ORAL | 1 refills | Status: DC

## 2016-04-02 NOTE — Telephone Encounter (Signed)
Pt aware med erx . AE scheduled for 2/22 at 2:30.

## 2016-04-20 DIAGNOSIS — M5416 Radiculopathy, lumbar region: Secondary | ICD-10-CM | POA: Diagnosis not present

## 2016-04-22 NOTE — Progress Notes (Deleted)
Patient ID: Savannah Richards, female   DOB: 10/27/1975, 41 y.o.   MRN: [id number] ANNUAL PREVENTATIVE CARE GYN  ENCOUNTER NOTE  Subjective:       Savannah Richards is a 41 y.o. [phone] female here for a routine annual gynecologic exam.  Current complaints: 1.    Gynecologic History No LMP recorded. Patient has had a hysterectomy.status post TVH Contraception: status post hysterectomystatus post TVH Last Pap: 01/2014 neg/neg. Results were: normal Last mammogram: n/a. Results were: n/ASSESSMENT: History of endometriosis; Currently asymptomatic  Obstetric History OB History  Gravida Para Term Preterm AB Living  4 3 3   1 3   SAB TAB Ectopic Multiple Live Births  1       3    # Outcome Date GA Lbr Len/2nd Weight Sex Delivery Anes PTL Lv  4 Term 2011   8 lb 1.8 oz (3.679 kg) M CS-LTranv   LIV  3 Term 2010   8 lb 1.8 oz (3.679 kg)  CS-LTranv   LIV  2 SAB           1 Term    8 lb 1.9 oz (3.683 kg) M Vag-Spont   LIV      Past Medical History:  Diagnosis Date  . Anxiety   . Dyspareunia   . Elevated hemoglobin (HCC)   . Elevated lipids   . Endometriosis   . GERD (gastroesophageal reflux disease)   . Hematuria   . Hypertension   . IBS (irritable bowel syndrome)   . Infertility, female   . Irregular periods   . Ovarian cyst   . Pelvic adhesions   . Urinary urgency   . Vaginal discharge   . Vaginal Pap smear, abnormal     Past Surgical History:  Procedure Laterality Date  . CESAREAN SECTION     x 2  . HERNIA REPAIR     umbilicus  . MOLE REMOVAL     multiple  . VAGINAL HYSTERECTOMY  10/2011    Current Outpatient Prescriptions on File Prior to Visit  Medication Sig Dispense Refill  . cetirizine (ZYRTEC) 10 MG tablet Take 10 mg by mouth as needed.     . dicyclomine (BENTYL) 10 MG capsule Take 10 mg by mouth as needed.     Marland Kitchen escitalopram (LEXAPRO) 10 MG tablet Take 1 tablet (10 mg total) by mouth daily. 30 tablet 1  . losartan (COZAAR) 100 MG tablet TAKE 1  TABLET BY MOUTH ONCE DAILY. 90 tablet 0   No current facility-administered medications on file prior to visit.     Allergies  Allergen Reactions  . Bactrim [Sulfamethoxazole-Trimethoprim]   . Ciprofloxacin Hcl   . Shellfish Allergy     Social History   Social History  . Marital status: Married    Spouse name: N/A  . Number of children: N/A  . Years of education: N/A   Occupational History  . Not on file.   Social History Main Topics  . Smoking status: Never Smoker  . Smokeless tobacco: Never Used  . Alcohol use No  . Drug use: No  . Sexual activity: Yes    Partners: Female    Birth control/ protection: Surgical   Other Topics Concern  . Not on file   Social History Narrative  . No narrative on file    Family History  Problem Relation Age of Onset  . Osteoporosis Mother   . Endometriosis Mother   . Hypertension Mother   . Diabetes Father   .  Heart disease Father   . Hypertension Father   . Diabetes Maternal Grandfather   . Colon cancer Paternal Grandmother   . Diabetes Paternal Grandfather   . Breast cancer Neg Hx   . Ovarian cancer Neg Hx   . Thyroid disease Sister   . Hypertension Brother   . Hyperlipidemia Brother   . Stroke Maternal Grandmother     The following portions of the patient's history were reviewed and updated as appropriate: allergies, current medications, past family history, past medical history, past social history, past surgical history and problem list.  Review of Systems ROS Review of Systems - General ROS: negative for - chills, fatigue, fever, hot flashes, night sweats, weight gain or weight loss Psychological ROS: negative for - anxiety, decreased libido, depression, mood swings, physical abuse or sexual abuse Ophthalmic ROS: negative for - blurry vision, eye pain or loss of vision ENT ROS: negative for - headaches, hearing change, visual changes or vocal changes Allergy and Immunology ROS: negative for - hives, itchy/watery  eyes or seasonal allergies Hematological and Lymphatic ROS: negative for - bleeding problems, bruising, swollen lymph nodes or weight loss Endocrine ROS: negative for - galactorrhea, hair pattern changes, hot flashes, malaise/lethargy, mood swings, palpitations, polydipsia/polyuria, skin changes, temperature intolerance or unexpected weight changes Breast ROS: negative for - new or changing breast lumps or nipple discharge Respiratory ROS: negative for - cough or shortness of breath Cardiovascular ROS: negative for - chest pain, irregular heartbeat, palpitations or shortness of breath Gastrointestinal ROS: no abdominal pain, change in bowel habits, or black or bloody stools Genito-Urinary ROS: no dysuria, trouble voiding, or hematuria Musculoskeletal ROS: negative for - joint pain or joint stiffness Neurological ROS: negative for - bowel and bladder control changes Dermatological ROS: negative for rash and skin lesion changes; POSITIVE-hair loss   Objective:   There were no vitals taken for this visit. CONSTITUTIONAL: Well-developed, well-nourished female in no acute distress.  PSYCHIATRIC: Normal mood and affect. Normal behavior. Normal judgment and thought content. NEUROLGIC: Alert and oriented to person, place, and time. Normal muscle tone coordination. No cranial nerve deficit noted. HENT:  Normocephalic, atraumatic, External right and left ear normal. Oropharynx is clear and moist EYES: Conjunctivae and EOM are normal. Pupils are equal, round, and reactive to light. No scleral icterus.  NECK: Normal range of motion, supple, no masses.  Normal thyroid.  SKIN: Skin is warm and dry. No rash noted. Not diaphoretic. No erythema. No pallor. CARDIOVASCULAR: Normal heart rate noted, regular rhythm, no murmur. RESPIRATORY: Clear to auscultation bilaterally. Effort and breath sounds normal, no problems with respiration noted. BREASTS: Symmetric in size. No masses, skin changes, nipple drainage, or  lymphadenopathy. ABDOMEN: Soft, normal bowel sounds, no distention noted.  No tenderness, rebound or guarding.  BLADDER: Normal PELVIC:  External Genitalia: Normal  BUS: Normal  Vagina: Normal  Cervix: surgically absent  Uterus: surgically absent  Adnexa: Normal; no masses; no tenderness  RV: External Exam NormaI  MUSCULOSKELETAL: Normal range of motion. No tenderness.  No cyanosis, clubbing, or edema.  2+ distal pulses. LYMPHATIC: No Axillary, Supraclavicular, or Inguinal Adenopathy.    Assessment:   Annual gynecologic examination 41 y.o. Contraception: tubal ligation Normal BMI Hypertension. Anxiety, stable  Plan:  Pap: Not needed Mammogram: Not Indicated Stool Guaiac Testing:  Not Indicated Labs: vit d lipid a1c fbs tsh cmp Routine preventative health maintenance measures emphasized: Exercise/Diet/Weight control, Tobacco Warnings and Alcohol/Substance use risks  Lexapro 10 mg a day RF x1year Return to Clinic -  1 Year   Darol Destinerystal Shriyan Arakawa, [place]   Note: This dictation was prepared with Dragon dictation along with smaller phrase technology. Any transcriptional errors that result from this process are unintentional.

## 2016-04-29 ENCOUNTER — Encounter: Payer: Federal, State, Local not specified - PPO | Admitting: Obstetrics and Gynecology

## 2016-04-30 ENCOUNTER — Encounter: Payer: Federal, State, Local not specified - PPO | Admitting: Obstetrics and Gynecology

## 2016-04-30 DIAGNOSIS — M5416 Radiculopathy, lumbar region: Secondary | ICD-10-CM | POA: Diagnosis not present

## 2016-04-30 DIAGNOSIS — M5127 Other intervertebral disc displacement, lumbosacral region: Secondary | ICD-10-CM | POA: Diagnosis not present

## 2016-05-04 DIAGNOSIS — M5416 Radiculopathy, lumbar region: Secondary | ICD-10-CM | POA: Diagnosis not present

## 2016-05-11 ENCOUNTER — Ambulatory Visit (INDEPENDENT_AMBULATORY_CARE_PROVIDER_SITE_OTHER): Payer: Federal, State, Local not specified - PPO | Admitting: Obstetrics and Gynecology

## 2016-05-11 ENCOUNTER — Encounter: Payer: Self-pay | Admitting: Obstetrics and Gynecology

## 2016-05-11 VITALS — BP 142/82 | HR 79 | Ht 65.0 in | Wt 149.8 lb

## 2016-05-11 DIAGNOSIS — R6882 Decreased libido: Secondary | ICD-10-CM | POA: Diagnosis not present

## 2016-05-11 DIAGNOSIS — F419 Anxiety disorder, unspecified: Secondary | ICD-10-CM

## 2016-05-11 MED ORDER — BUPROPION HCL ER (XL) 150 MG PO TB24: 150.0000 mg | ORAL_TABLET | Freq: Every day | ORAL | 1 refills | Status: DC

## 2016-05-11 NOTE — Patient Instructions (Signed)
1. Stop Lexapro 2. Begin Wellbutrin XL 150 mg a day 3. Return in 6 weeks for follow-up

## 2016-05-11 NOTE — Progress Notes (Signed)
Chief complaint: 1. Anxiety 2. Medication management  Patient presents for follow-up on anxiety/depression. She has been on Zoloft in the past but the medication was discontinued due to possible associated hair loss with the medication. She was switched to Lexapro. Moods swings were stabilized; however, the patient has noted a sense of dullness in her affect. She feels that her emotions are blunted; coming off of Lexapro, her normalcy in mood responsiveness returned, as did the major mood swings. Sex drive was also blunted.  Patient is requesting a possible alteration of medication which will help her anxiety without contribute to her previously noted medication side effects.  OBJECTIVE: BP (!) 142/82   Pulse 79   Ht 5\' 5"  (1.651 m)   Wt 149 lb 12.8 oz (67.9 kg)   BMI 24.93 kg/m  Physical exam-deferred  ASSESSMENT: 1. Anxiety, suboptimally managed on SSRI medications (Zoloft, Lexapro) due to side effects 2. Decreased libido  PLAN: 1. Discontinue Lexapro 2. Start Wellbutrin SR 150 mg a day 3. Return in 6 weeks for follow-up  A total of 15 minutes were spent face-to-face with the patient during this encounter and over half of that time dealt with counseling and coordination of care.  Herold HarmsMartin A Barbara Keng, MD  Note: This dictation was prepared with Dragon dictation along with smaller phrase technology. Any transcriptional errors that result from this process are unintentional.

## 2016-05-14 DIAGNOSIS — M5416 Radiculopathy, lumbar region: Secondary | ICD-10-CM | POA: Diagnosis not present

## 2016-05-25 ENCOUNTER — Encounter: Payer: Self-pay | Admitting: Family Medicine

## 2016-05-25 ENCOUNTER — Ambulatory Visit (INDEPENDENT_AMBULATORY_CARE_PROVIDER_SITE_OTHER): Payer: Federal, State, Local not specified - PPO | Admitting: Family Medicine

## 2016-05-25 VITALS — BP 136/76 | HR 85 | Temp 97.9°F | Resp 17 | Ht 65.0 in | Wt 153.8 lb

## 2016-05-25 DIAGNOSIS — R454 Irritability and anger: Secondary | ICD-10-CM

## 2016-05-25 MED ORDER — FLUOXETINE HCL 20 MG PO CAPS: 20.0000 mg | ORAL_CAPSULE | Freq: Every day | ORAL | 2 refills | Status: DC

## 2016-05-25 NOTE — Progress Notes (Signed)
Name: Savannah Richards   MRN: [id number]    DOB: 22-Sep-1975   Date:05/25/2016       Progress Note  Subjective  Chief Complaint  Chief Complaint  Patient presents with  . Follow-up    Discuss Mood     HPI  Patient presents to discuss changing her medication for mood symptoms. She reports that since hysterectomy in 2015, she has been having multiple mood symptoms including irritability, quickly getting annoyed, and feeling tired. She was started on Zoloft by Dr. Greggory Keen which was later switched to Lexapro, then to Wellbutrin and finally back to Lexapro. She reprots Lexapro helps with her symptoms listed above but makes her feel 'numb' and emotionless, she was recently at her husband's friends' funeral and did not cry at all when her husband spoke of his friend.  She wishes to change her emdication to something that will work with her mood but will also make her feel emotional when needed and appropriate.    Past Medical History:  Diagnosis Date  . Anxiety   . Dyspareunia   . Elevated hemoglobin (HCC)   . Elevated lipids   . Endometriosis   . GERD (gastroesophageal reflux disease)   . Hematuria   . Herniated cervical disc   . Hypertension   . IBS (irritable bowel syndrome)   . Infertility, female   . Irregular periods   . Ovarian cyst   . Pelvic adhesions   . Urinary urgency   . Vaginal discharge   . Vaginal Pap smear, abnormal     Past Surgical History:  Procedure Laterality Date  . CESAREAN SECTION     x 2  . HERNIA REPAIR     umbilicus  . MOLE REMOVAL     multiple  . VAGINAL HYSTERECTOMY  10/2011    Family History  Problem Relation Age of Onset  . Osteoporosis Mother   . Endometriosis Mother   . Hypertension Mother   . Diabetes Father   . Heart disease Father   . Hypertension Father   . Thyroid disease Sister   . Hypertension Brother   . Hyperlipidemia Brother   . Diabetes Maternal Grandfather   . Colon cancer Paternal Grandmother   . Diabetes  Paternal Grandfather   . Stroke Maternal Grandmother   . Breast cancer Neg Hx   . Ovarian cancer Neg Hx     Social History   Social History  . Marital status: Married    Spouse name: N/A  . Number of children: N/A  . Years of education: N/A   Occupational History  . Not on file.   Social History Main Topics  . Smoking status: Never Smoker  . Smokeless tobacco: Never Used  . Alcohol use No  . Drug use: No  . Sexual activity: Yes    Partners: Female    Birth control/ protection: Surgical   Other Topics Concern  . Not on file   Social History Narrative  . No narrative on file     Current Outpatient Prescriptions:  .  cetirizine (ZYRTEC) 10 MG tablet, Take 10 mg by mouth as needed. , Disp: , Rfl:  .  dicyclomine (BENTYL) 10 MG capsule, Take 10 mg by mouth as needed. , Disp: , Rfl:  .  escitalopram (LEXAPRO) 10 MG tablet, Take 1 tablet (10 mg total) by mouth daily., Disp: 30 tablet, Rfl: 1 .  gabapentin (NEURONTIN) 600 MG tablet, Take 1/2 to 2 tables at bedtime, Disp: , Rfl:  .  losartan (COZAAR) 100 MG tablet, TAKE 1 TABLET BY MOUTH ONCE DAILY., Disp: 90 tablet, Rfl: 0  Allergies  Allergen Reactions  . Bactrim [Sulfamethoxazole-Trimethoprim]   . Ciprofloxacin Hcl   . Shellfish Allergy      ROS  Please see history of present illness for complete discussion of ROS  Objective  Vitals:   05/25/16 1430  BP: 136/76  Pulse: 85  Resp: 17  Temp: 97.9 F (36.6 C)  TempSrc: Oral  SpO2: 97%  Weight: 153 lb 12.8 oz (69.8 kg)  Height: 5\' 5"  (1.651 m)    Physical Exam  Constitutional: She is oriented to person, place, and time and well-developed, well-nourished, and in no distress.  HENT:  Head: Normocephalic and atraumatic.  Cardiovascular: Normal rate, regular rhythm and normal heart sounds.   No murmur heard. Pulmonary/Chest: Effort normal and breath sounds normal. She has no wheezes.  Neurological: She is alert and oriented to person, place, and time.   Psychiatric: Mood, memory, affect and judgment normal.  Nursing note and vitals reviewed.     Assessment & Plan  1. 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 If patient has adequate symptom relief, consider referral to psychiatry for further management. - FLUoxetine (PROZAC) 20 MG capsule; Take 1 capsule (20 mg total) by mouth at bedtime.  Dispense: 30 capsule; Refill: 2   Rachella Basden Asad A. Faylene KurtzShah [facility] 05/25/2016 2:56 PM

## 2016-05-31 DIAGNOSIS — F419 Anxiety disorder, unspecified: Secondary | ICD-10-CM | POA: Diagnosis not present

## 2016-06-01 DIAGNOSIS — M5416 Radiculopathy, lumbar region: Secondary | ICD-10-CM | POA: Diagnosis not present

## 2016-06-24 ENCOUNTER — Encounter: Payer: Federal, State, Local not specified - PPO | Admitting: Obstetrics and Gynecology

## 2016-06-28 DIAGNOSIS — F419 Anxiety disorder, unspecified: Secondary | ICD-10-CM | POA: Diagnosis not present

## 2016-07-06 DIAGNOSIS — M5416 Radiculopathy, lumbar region: Secondary | ICD-10-CM | POA: Diagnosis not present

## 2016-07-08 ENCOUNTER — Ambulatory Visit: Payer: Federal, State, Local not specified - PPO | Admitting: Family Medicine

## 2016-07-13 DIAGNOSIS — M5416 Radiculopathy, lumbar region: Secondary | ICD-10-CM | POA: Diagnosis not present

## 2016-07-13 DIAGNOSIS — M5127 Other intervertebral disc displacement, lumbosacral region: Secondary | ICD-10-CM | POA: Diagnosis not present

## 2016-07-23 DIAGNOSIS — I10 Essential (primary) hypertension: Secondary | ICD-10-CM | POA: Diagnosis not present

## 2016-07-23 DIAGNOSIS — G8929 Other chronic pain: Secondary | ICD-10-CM | POA: Diagnosis not present

## 2016-07-23 DIAGNOSIS — F329 Major depressive disorder, single episode, unspecified: Secondary | ICD-10-CM | POA: Diagnosis not present

## 2016-07-23 DIAGNOSIS — M5127 Other intervertebral disc displacement, lumbosacral region: Secondary | ICD-10-CM | POA: Diagnosis not present

## 2016-07-23 DIAGNOSIS — M5440 Lumbago with sciatica, unspecified side: Secondary | ICD-10-CM | POA: Diagnosis not present

## 2016-07-23 DIAGNOSIS — Z01818 Encounter for other preprocedural examination: Secondary | ICD-10-CM | POA: Diagnosis not present

## 2016-07-23 DIAGNOSIS — M5416 Radiculopathy, lumbar region: Secondary | ICD-10-CM | POA: Diagnosis not present

## 2016-07-23 DIAGNOSIS — K219 Gastro-esophageal reflux disease without esophagitis: Secondary | ICD-10-CM | POA: Diagnosis not present

## 2016-07-23 DIAGNOSIS — R9431 Abnormal electrocardiogram [ECG] [EKG]: Secondary | ICD-10-CM | POA: Diagnosis not present

## 2016-07-26 DIAGNOSIS — F419 Anxiety disorder, unspecified: Secondary | ICD-10-CM | POA: Diagnosis not present

## 2016-07-29 DIAGNOSIS — R9431 Abnormal electrocardiogram [ECG] [EKG]: Secondary | ICD-10-CM | POA: Diagnosis not present

## 2016-08-03 DIAGNOSIS — I358 Other nonrheumatic aortic valve disorders: Secondary | ICD-10-CM | POA: Insufficient documentation

## 2016-08-03 DIAGNOSIS — Z01810 Encounter for preprocedural cardiovascular examination: Secondary | ICD-10-CM | POA: Diagnosis not present

## 2016-08-03 DIAGNOSIS — I10 Essential (primary) hypertension: Secondary | ICD-10-CM | POA: Diagnosis not present

## 2016-08-14 ENCOUNTER — Other Ambulatory Visit: Payer: Self-pay | Admitting: Family Medicine

## 2016-08-14 DIAGNOSIS — I10 Essential (primary) hypertension: Secondary | ICD-10-CM

## 2016-08-19 DIAGNOSIS — Z9889 Other specified postprocedural states: Secondary | ICD-10-CM | POA: Insufficient documentation

## 2016-08-19 DIAGNOSIS — M5117 Intervertebral disc disorders with radiculopathy, lumbosacral region: Secondary | ICD-10-CM | POA: Diagnosis not present

## 2016-08-19 DIAGNOSIS — M5416 Radiculopathy, lumbar region: Secondary | ICD-10-CM | POA: Diagnosis not present

## 2016-08-19 DIAGNOSIS — I10 Essential (primary) hypertension: Secondary | ICD-10-CM | POA: Diagnosis not present

## 2016-08-19 DIAGNOSIS — M5127 Other intervertebral disc displacement, lumbosacral region: Secondary | ICD-10-CM | POA: Diagnosis not present

## 2016-08-19 DIAGNOSIS — I358 Other nonrheumatic aortic valve disorders: Secondary | ICD-10-CM | POA: Diagnosis not present

## 2016-08-19 DIAGNOSIS — K219 Gastro-esophageal reflux disease without esophagitis: Secondary | ICD-10-CM | POA: Diagnosis not present

## 2016-08-19 DIAGNOSIS — Z79899 Other long term (current) drug therapy: Secondary | ICD-10-CM | POA: Diagnosis not present

## 2016-08-19 HISTORY — PX: MICRODISCECTOMY LUMBAR: SUR864

## 2016-08-20 DIAGNOSIS — I10 Essential (primary) hypertension: Secondary | ICD-10-CM | POA: Diagnosis not present

## 2016-08-20 DIAGNOSIS — I358 Other nonrheumatic aortic valve disorders: Secondary | ICD-10-CM | POA: Diagnosis not present

## 2016-08-20 DIAGNOSIS — Z79899 Other long term (current) drug therapy: Secondary | ICD-10-CM | POA: Diagnosis not present

## 2016-08-20 DIAGNOSIS — M5416 Radiculopathy, lumbar region: Secondary | ICD-10-CM | POA: Diagnosis not present

## 2016-08-20 DIAGNOSIS — M5117 Intervertebral disc disorders with radiculopathy, lumbosacral region: Secondary | ICD-10-CM | POA: Diagnosis not present

## 2016-08-20 DIAGNOSIS — K219 Gastro-esophageal reflux disease without esophagitis: Secondary | ICD-10-CM | POA: Diagnosis not present

## 2016-09-01 DIAGNOSIS — M5416 Radiculopathy, lumbar region: Secondary | ICD-10-CM | POA: Diagnosis not present

## 2016-09-07 DIAGNOSIS — M5416 Radiculopathy, lumbar region: Secondary | ICD-10-CM | POA: Diagnosis not present

## 2016-09-09 DIAGNOSIS — M5416 Radiculopathy, lumbar region: Secondary | ICD-10-CM | POA: Diagnosis not present

## 2016-09-13 DIAGNOSIS — M5416 Radiculopathy, lumbar region: Secondary | ICD-10-CM | POA: Diagnosis not present

## 2016-09-16 DIAGNOSIS — M5416 Radiculopathy, lumbar region: Secondary | ICD-10-CM | POA: Diagnosis not present

## 2016-09-20 DIAGNOSIS — M5416 Radiculopathy, lumbar region: Secondary | ICD-10-CM | POA: Diagnosis not present

## 2016-09-29 DIAGNOSIS — M5416 Radiculopathy, lumbar region: Secondary | ICD-10-CM | POA: Diagnosis not present

## 2016-10-06 DIAGNOSIS — M5416 Radiculopathy, lumbar region: Secondary | ICD-10-CM | POA: Diagnosis not present

## 2016-11-30 DIAGNOSIS — F419 Anxiety disorder, unspecified: Secondary | ICD-10-CM | POA: Diagnosis not present

## 2016-11-30 DIAGNOSIS — H9213 Otorrhea, bilateral: Secondary | ICD-10-CM | POA: Diagnosis not present

## 2016-12-01 DIAGNOSIS — H9213 Otorrhea, bilateral: Secondary | ICD-10-CM | POA: Diagnosis not present

## 2016-12-30 ENCOUNTER — Other Ambulatory Visit: Payer: Self-pay | Admitting: Family Medicine

## 2016-12-30 DIAGNOSIS — I10 Essential (primary) hypertension: Secondary | ICD-10-CM

## 2016-12-31 ENCOUNTER — Ambulatory Visit (INDEPENDENT_AMBULATORY_CARE_PROVIDER_SITE_OTHER): Payer: Federal, State, Local not specified - PPO | Admitting: Family Medicine

## 2016-12-31 ENCOUNTER — Encounter: Payer: Self-pay | Admitting: Family Medicine

## 2016-12-31 DIAGNOSIS — I10 Essential (primary) hypertension: Secondary | ICD-10-CM

## 2016-12-31 MED ORDER — LOSARTAN POTASSIUM 100 MG PO TABS: 100.0000 mg | ORAL_TABLET | Freq: Every day | ORAL | 0 refills | Status: DC

## 2016-12-31 NOTE — Progress Notes (Signed)
Name: Savannah GrimmerCrystal Hagood Taffe   MRN: [id number]    DOB: Nov 22, 1975   Date:12/31/2016       Progress Note  Subjective  Chief Complaint  Chief Complaint  Patient presents with  . Medication Refill    losartan     Hypertension  This is a chronic problem. The problem is unchanged. The problem is controlled. Pertinent negatives include no blurred vision, chest pain, headaches, palpitations or shortness of breath. Past treatments include angiotensin blockers. There is no history of kidney disease or CAD/MI.     Past Medical History:  Diagnosis Date  . Anxiety   . Dyspareunia   . Elevated hemoglobin (HCC)   . Elevated lipids   . Endometriosis   . GERD (gastroesophageal reflux disease)   . Hematuria   . Herniated cervical disc   . Hypertension   . IBS (irritable bowel syndrome)   . Infertility, female   . Irregular periods   . Ovarian cyst   . Pelvic adhesions   . Urinary urgency   . Vaginal discharge   . Vaginal Pap smear, abnormal     Past Surgical History:  Procedure Laterality Date  . CESAREAN SECTION     x 2  . HERNIA REPAIR     umbilicus  . MICRODISCECTOMY LUMBAR  08/19/2016   L4-L5  . MOLE REMOVAL     multiple  . VAGINAL HYSTERECTOMY  10/2011    Family History  Problem Relation Age of Onset  . Osteoporosis Mother   . Endometriosis Mother   . Hypertension Mother   . Diabetes Father   . Heart disease Father   . Hypertension Father   . Thyroid disease Sister   . Hypertension Brother   . Hyperlipidemia Brother   . Diabetes Maternal Grandfather   . Colon cancer Paternal Grandmother   . Diabetes Paternal Grandfather   . Stroke Maternal Grandmother   . Breast cancer Neg Hx   . Ovarian cancer Neg Hx     Social History   Social History  . Marital status: Married    Spouse name: N/A  . Number of children: N/A  . Years of education: N/A   Occupational History  . Not on file.   Social History Main Topics  . Smoking status: Never Smoker  . Smokeless  tobacco: Never Used  . Alcohol use No  . Drug use: No  . Sexual activity: Yes    Partners: Female    Birth control/ protection: Surgical   Other Topics Concern  . Not on file   Social History Narrative  . No narrative on file     Current Outpatient Prescriptions:  .  cetirizine (ZYRTEC) 10 MG tablet, Take 10 mg by mouth as needed. , Disp: , Rfl:  .  dicyclomine (BENTYL) 10 MG capsule, Take 10 mg by mouth as needed. , Disp: , Rfl:  .  DULoxetine (CYMBALTA) 20 MG capsule, Take 1 capsule by mouth 2 (two) times daily., Disp: , Rfl:  .  losartan (COZAAR) 100 MG tablet, TAKE 1 TABLET BY MOUTH ONCE DAILY., Disp: 90 tablet, Rfl: 0 .  FLUoxetine (PROZAC) 20 MG capsule, Take 1 capsule (20 mg total) by mouth at bedtime. (Patient not taking: Reported on 12/31/2016), Disp: 30 capsule, Rfl: 2 .  gabapentin (NEURONTIN) 600 MG tablet, Take 1/2 to 2 tables at bedtime, Disp: , Rfl:   Allergies  Allergen Reactions  . Bactrim [Sulfamethoxazole-Trimethoprim]   . Ciprofloxacin Hcl   . Shellfish Allergy  Review of Systems  Eyes: Negative for blurred vision.  Respiratory: Negative for shortness of breath.   Cardiovascular: Negative for chest pain and palpitations.  Neurological: Negative for headaches.      Objective  Vitals:   12/31/16 1104  BP: 122/74  Pulse: 93  Resp: 14  Temp: 98.2 F (36.8 C)  TempSrc: Oral  SpO2: 98%  Weight: 157 lb 9.6 oz (71.5 kg)    Physical Exam  Constitutional: She is oriented to person, place, and time and well-developed, well-nourished, and in no distress.  HENT:  Head: Normocephalic and atraumatic.  Cardiovascular: Normal rate, regular rhythm, S1 normal and S2 normal.   Murmur heard.  Systolic murmur is present with a grade of 2/6  Pulmonary/Chest: Effort normal and breath sounds normal. She has no wheezes.  Neurological: She is alert and oriented to person, place, and time.  Psychiatric: Mood, memory, affect and judgment normal.  Nursing  note and vitals reviewed.      Assessment & Plan  1. Essential hypertension BP stable on present anti-hypertensive treatment - losartan (COZAAR) 100 MG tablet; Take 1 tablet (100 mg total) by mouth daily.  Dispense: 90 tablet; Refill: 0   Eyonna Sandstrom Asad A. Faylene [facility] 12/31/2016 11:13 AM

## 2017-03-15 DIAGNOSIS — H6012 Cellulitis of left external ear: Secondary | ICD-10-CM | POA: Diagnosis not present

## 2017-03-15 DIAGNOSIS — H60543 Acute eczematoid otitis externa, bilateral: Secondary | ICD-10-CM | POA: Diagnosis not present

## 2017-03-24 ENCOUNTER — Encounter: Payer: Self-pay | Admitting: Family Medicine

## 2017-03-24 ENCOUNTER — Ambulatory Visit (INDEPENDENT_AMBULATORY_CARE_PROVIDER_SITE_OTHER): Payer: Federal, State, Local not specified - PPO | Admitting: Family Medicine

## 2017-03-24 VITALS — BP 124/76 | HR 75 | Temp 98.2°F | Resp 14 | Wt 157.2 lb

## 2017-03-24 DIAGNOSIS — R454 Irritability and anger: Secondary | ICD-10-CM

## 2017-03-24 DIAGNOSIS — I10 Essential (primary) hypertension: Secondary | ICD-10-CM

## 2017-03-24 DIAGNOSIS — E7800 Pure hypercholesterolemia, unspecified: Secondary | ICD-10-CM | POA: Diagnosis not present

## 2017-03-24 LAB — LIPID PANEL
Cholesterol: 260 mg/dL — ABNORMAL HIGH (ref <200)
HDL: 53 mg/dL (ref >50)
LDL CHOLESTEROL (CALC): 186 mg/dL — AB
NON-HDL CHOLESTEROL (CALC): 207 mg/dL — AB (ref <130)
Total CHOL/HDL Ratio: 4.9 (calc) (ref <5.0)
Triglycerides: 96 mg/dL (ref <150)

## 2017-03-24 LAB — COMPLETE METABOLIC PANEL WITH GFR
AG RATIO: 1.3 (calc) (ref 1.0–2.5)
ALT: 45 U/L — AB (ref 6–29)
AST: 30 U/L (ref 10–30)
Albumin: 4.2 g/dL (ref 3.6–5.1)
Alkaline phosphatase (APISO): 75 U/L (ref 33–115)
BUN: 15 mg/dL (ref 7–25)
CALCIUM: 9.5 mg/dL (ref 8.6–10.2)
CO2: 31 mmol/L (ref 20–32)
CREATININE: 0.64 mg/dL (ref 0.50–1.10)
Chloride: 103 mmol/L (ref 98–110)
GFR, EST AFRICAN AMERICAN: 130 mL/min/{1.73_m2} (ref >=60)
GFR, EST NON AFRICAN AMERICAN: 112 mL/min/{1.73_m2} (ref >=60)
Globulin: 3.3 g/dL (calc) (ref 1.9–3.7)
Glucose, Bld: 94 mg/dL (ref 65–99)
POTASSIUM: 4.6 mmol/L (ref 3.5–5.3)
Sodium: 138 mmol/L (ref 135–146)
TOTAL PROTEIN: 7.5 g/dL (ref 6.1–8.1)
Total Bilirubin: 0.4 mg/dL (ref 0.2–1.2)

## 2017-03-24 MED ORDER — DULOXETINE HCL 20 MG PO CPEP: 20.0000 mg | ORAL_CAPSULE | Freq: Two times a day (BID) | ORAL | 0 refills | Status: DC

## 2017-03-24 MED ORDER — TELMISARTAN 40 MG PO TABS: 40.0000 mg | ORAL_TABLET | Freq: Every day | ORAL | 0 refills | Status: DC

## 2017-03-24 NOTE — Progress Notes (Signed)
Name: Savannah Richards   MRN: [id number]    DOB: September 20, 1975   Date:03/24/2017       Progress Note  Subjective  Chief Complaint  Chief Complaint  Patient presents with  . Hypertension    Recall on losartan; need new Bp meds. Pt denies nay issue   . Hyperlipidemia    Not taking any meds but pt states she was doing good but had back surgery and she fell off but now she is getting back at it  . Medication Refill    Hypertension  This is a chronic problem. The problem is unchanged. The problem is controlled. Pertinent negatives include no blurred vision or palpitations. Past treatments include angiotensin blockers (CHange to a different ARB becasue of recall on Losartan.). There is no history of kidney disease, CAD/MI or CVA.  Hyperlipidemia  This is a chronic problem. The problem is uncontrolled. Recent lipid tests were reviewed and are high. She is currently on no antihyperlipidemic treatment.  Depression       The patient presents with depression.  This is a recurrent problem.  The onset quality is gradual.   The problem has been gradually improving since onset.  Associated symptoms include fatigue, irritable and body aches.  Associated symptoms include no helplessness, no hopelessness and not sad.  Past treatments include SNRIs - Serotonin and norepinephrine reuptake inhibitors (has been seen by NP for Irritability and started on Cymbalta.).  Previous treatment provided significant relief.  Past medical history includes chronic pain and depression.      Past Medical History:  Diagnosis Date  . Anxiety   . Dyspareunia   . Elevated hemoglobin (HCC)   . Elevated lipids   . Endometriosis   . GERD (gastroesophageal reflux disease)   . Hematuria   . Herniated cervical disc   . Hypertension   . IBS (irritable bowel syndrome)   . Infertility, female   . Irregular periods   . Ovarian cyst   . Pelvic adhesions   . Urinary urgency   . Vaginal discharge   . Vaginal Pap smear,  abnormal     Past Surgical History:  Procedure Laterality Date  . CESAREAN SECTION     x 2  . HERNIA REPAIR     umbilicus  . MICRODISCECTOMY LUMBAR  08/19/2016   L4-L5  . MOLE REMOVAL     multiple  . VAGINAL HYSTERECTOMY  10/2011    Family History  Problem Relation Age of Onset  . Osteoporosis Mother   . Endometriosis Mother   . Hypertension Mother   . Diabetes Father   . Heart disease Father   . Hypertension Father   . Thyroid disease Sister   . Hypertension Brother   . Hyperlipidemia Brother   . Diabetes Maternal Grandfather   . Colon cancer Paternal Grandmother   . Diabetes Paternal Grandfather   . Stroke Maternal Grandmother   . Breast cancer Neg Hx   . Ovarian cancer Neg Hx     Social History   Socioeconomic History  . Marital status: Married    Spouse name: Not on file  . Number of children: Not on file  . Years of education: Not on file  . Highest education level: Not on file  Social Needs  . Financial resource strain: Not on file  . Food insecurity - worry: Not on file  . Food insecurity - inability: Not on file  . Transportation needs - medical: Not on file  . Transportation needs -  non-medical: Not on file  Occupational History  . Not on file  Tobacco Use  . Smoking status: Never Smoker  . Smokeless tobacco: Never Used  Substance and Sexual Activity  . Alcohol use: No    Alcohol/week: 0.0 oz  . Drug use: No  . Sexual activity: Yes    Partners: Female    Birth control/protection: Surgical  Other Topics Concern  . Not on file  Social History Narrative  . Not on file     Current Outpatient Medications:  .  dicyclomine (BENTYL) 10 MG capsule, Take 10 mg by mouth as needed. , Disp: , Rfl:  .  DULoxetine (CYMBALTA) 20 MG capsule, Take 1 capsule by mouth 2 (two) times daily., Disp: , Rfl:  .  losartan (COZAAR) 100 MG tablet, Take 1 tablet (100 mg total) by mouth daily., Disp: 90 tablet, Rfl: 0 .  cetirizine (ZYRTEC) 10 MG tablet, Take 10 mg by  mouth as needed. , Disp: , Rfl:  .  FLUoxetine (PROZAC) 20 MG capsule, Take 1 capsule (20 mg total) by mouth at bedtime. (Patient not taking: Reported on 03/24/2017), Disp: 30 capsule, Rfl: 2  Allergies  Allergen Reactions  . Bactrim [Sulfamethoxazole-Trimethoprim]   . Ciprofloxacin Hcl   . Shellfish Allergy      Review of Systems  Constitutional: Positive for fatigue.  Eyes: Negative for blurred vision.  Cardiovascular: Negative for palpitations.  Psychiatric/Behavioral: Positive for depression.     Objective  Vitals:   03/24/17 0929  BP: 124/76  Pulse: 75  Resp: 14  Temp: 98.2 F (36.8 C)  TempSrc: Oral  SpO2: 98%  Weight: 157 lb 3.2 oz (71.3 kg)    Physical Exam  Constitutional: She is oriented to person, place, and time and well-developed, well-nourished, and in no distress. She is irritable.  HENT:  Head: Normocephalic and atraumatic.  Cardiovascular: Normal rate, regular rhythm, S1 normal and S2 normal.  Murmur heard.  Systolic murmur is present with a grade of 2/6. Pulmonary/Chest: Effort normal and breath sounds normal. She has no wheezes.  Neurological: She is alert and oriented to person, place, and time.  Psychiatric: Mood, memory, affect and judgment normal.  Nursing note and vitals reviewed.    Assessment & Plan  1. Essential hypertension DC losartan and start on Micardis 40 mg - telmisartan (MICARDIS) 40 MG tablet; Take 1 tablet (40 mg total) by mouth daily.  Dispense: 90 tablet; Refill: 0  2. Pure hypercholesterolemia Obtain FLP, adjust statin if appropriate - Lipid panel - COMPLETE METABOLIC PANEL WITH GFR  3. Behavior-irritability She has been taking Cymbalta 20 mg for symptoms of irritability and depression, she is requesting a one-month prescription until she can establish with a new psychiatrist. - DULoxetine (CYMBALTA) 20 MG capsule; Take 1 capsule (20 mg total) by mouth 2 (two) times daily.  Dispense: 30 capsule; Refill: 0   Alphons Burgert  Asad A. Faylene [facility] 03/24/2017 9:39 AM

## 2017-03-28 ENCOUNTER — Other Ambulatory Visit: Payer: Self-pay

## 2017-03-28 DIAGNOSIS — R7401 Elevation of levels of liver transaminase levels: Secondary | ICD-10-CM

## 2017-03-28 DIAGNOSIS — R740 Nonspecific elevation of levels of transaminase and lactic acid dehydrogenase [LDH]: Principal | ICD-10-CM

## 2017-03-31 ENCOUNTER — Other Ambulatory Visit: Payer: Self-pay

## 2017-03-31 ENCOUNTER — Telehealth: Payer: Self-pay | Admitting: Family Medicine

## 2017-03-31 DIAGNOSIS — I10 Essential (primary) hypertension: Secondary | ICD-10-CM

## 2017-03-31 MED ORDER — TELMISARTAN 80 MG PO TABS
80.0000 mg | ORAL_TABLET | Freq: Every day | ORAL | 0 refills | Status: DC
Start: 2017-03-31 — End: 2017-07-19

## 2017-03-31 NOTE — Telephone Encounter (Signed)
Left a detail message and letting her know that she can take two 40 mg tabs of the Micardis until she runs out and then pick up the new Rx at her pharmacy. [facility]

## 2017-03-31 NOTE — Telephone Encounter (Signed)
Yes, she may increase Micardis to 80 mg daily and a new prescription may be sent to her pharmacy.

## 2017-03-31 NOTE — Telephone Encounter (Signed)
Copied from CRM [phone]. Topic: Quick Communication - See Telephone Encounter >> Mar 31, 2017  1:16 PM Eston Mouldavis, Keghan Mcfarren B wrote: CRM for notification. See Telephone encounter for:  Pt states she was put on telmisartan (MICARDIS) 40 MG tablet  on the 1/17  and she has been checking her blood pressure daily-  and it is steadily going up- this mornings reading was  139/96.  She is asking if she needs to increase to 80 mg? 03/31/17.

## 2017-04-05 ENCOUNTER — Ambulatory Visit: Payer: Federal, State, Local not specified - PPO | Admitting: Family Medicine

## 2017-04-15 ENCOUNTER — Ambulatory Visit (INDEPENDENT_AMBULATORY_CARE_PROVIDER_SITE_OTHER): Payer: Federal, State, Local not specified - PPO | Admitting: Family Medicine

## 2017-04-15 ENCOUNTER — Encounter: Payer: Self-pay | Admitting: Family Medicine

## 2017-04-15 VITALS — BP 118/68 | HR 86 | Temp 98.0°F | Resp 14 | Ht 64.5 in | Wt 156.6 lb

## 2017-04-15 DIAGNOSIS — Z0001 Encounter for general adult medical examination with abnormal findings: Secondary | ICD-10-CM

## 2017-04-15 DIAGNOSIS — R454 Irritability and anger: Secondary | ICD-10-CM

## 2017-04-15 DIAGNOSIS — M2560 Stiffness of unspecified joint, not elsewhere classified: Secondary | ICD-10-CM

## 2017-04-15 DIAGNOSIS — Z0000 Encounter for general adult medical examination without abnormal findings: Secondary | ICD-10-CM

## 2017-04-15 DIAGNOSIS — R748 Abnormal levels of other serum enzymes: Secondary | ICD-10-CM

## 2017-04-15 MED ORDER — DULOXETINE HCL 20 MG PO CPEP: 20.0000 mg | ORAL_CAPSULE | Freq: Two times a day (BID) | ORAL | 0 refills | Status: DC

## 2017-04-15 NOTE — Progress Notes (Signed)
Name: Savannah Richards   MRN: [id number]    DOB: 02-Feb-1976   Date:04/15/2017       Progress Note  Subjective  Chief Complaint  Chief Complaint  Patient presents with  . Annual Exam    HPI  Pt. Presents for Annual Physical Exam. She goes to gynecology for female GYN screenings Mammogram not indicated, colonoscopy not indicated at this time She wants to add labs for rheumatoid arthritis, she has aches and pains in multiple joints (arms, legs, shoulder, chest, back) and sometimes her joints get stiff. Her psychiatrist believes she has Fibromyalgia but pt. Thinks she may have rheumatoid arthritis and she wants to check labs to confirm.    Past Medical History:  Diagnosis Date  . Anxiety   . Dyspareunia   . Elevated hemoglobin (HCC)   . Elevated lipids   . Endometriosis   . GERD (gastroesophageal reflux disease)   . Hematuria   . Herniated cervical disc   . Hypertension   . IBS (irritable bowel syndrome)   . Infertility, female   . Irregular periods   . Ovarian cyst   . Pelvic adhesions   . Urinary urgency   . Vaginal discharge   . Vaginal Pap smear, abnormal     Past Surgical History:  Procedure Laterality Date  . CESAREAN SECTION     x 2  . HERNIA REPAIR     umbilicus  . MICRODISCECTOMY LUMBAR  08/19/2016   L4-L5  . MOLE REMOVAL     multiple  . VAGINAL HYSTERECTOMY  10/2011    Family History  Problem Relation Age of Onset  . Osteoporosis Mother   . Endometriosis Mother   . Hypertension Mother   . Diabetes Father   . Heart disease Father   . Hypertension Father   . Thyroid disease Sister   . Hypertension Brother   . Hyperlipidemia Brother   . Diabetes Maternal Grandfather   . Colon cancer Paternal Grandmother   . Diabetes Paternal Grandfather   . Stroke Maternal Grandmother   . Breast cancer Neg Hx   . Ovarian cancer Neg Hx     Social History   Socioeconomic History  . Marital status: Married    Spouse name: Not on file  . Number of  children: Not on file  . Years of education: Not on file  . Highest education level: Not on file  Social Needs  . Financial resource strain: Not on file  . Food insecurity - worry: Not on file  . Food insecurity - inability: Not on file  . Transportation needs - medical: Not on file  . Transportation needs - non-medical: Not on file  Occupational History  . Not on file  Tobacco Use  . Smoking status: Never Smoker  . Smokeless tobacco: Never Used  Substance and Sexual Activity  . Alcohol use: No    Alcohol/week: 0.0 oz  . Drug use: No  . Sexual activity: Yes    Partners: Female    Birth control/protection: Surgical  Other Topics Concern  . Not on file  Social History Narrative  . Not on file     Current Outpatient Medications:  .  cetirizine (ZYRTEC) 10 MG tablet, Take 10 mg by mouth as needed. , Disp: , Rfl:  .  dicyclomine (BENTYL) 10 MG capsule, Take 10 mg by mouth as needed. , Disp: , Rfl:  .  DULoxetine (CYMBALTA) 20 MG capsule, Take 1 capsule (20 mg total) by mouth 2 (two) times  daily., Disp: 30 capsule, Rfl: 0 .  telmisartan (MICARDIS) 80 MG tablet, Take 1 tablet (80 mg total) by mouth daily., Disp: 90 tablet, Rfl: 0  Allergies  Allergen Reactions  . Bactrim [Sulfamethoxazole-Trimethoprim]   . Ciprofloxacin Hcl   . Shellfish Allergy      Review of Systems  Constitutional: Positive for malaise/fatigue. Negative for chills and fever.  HENT: Negative for congestion, ear pain, sinus pain and sore throat.   Eyes: Negative for blurred vision and double vision.  Respiratory: Negative for cough, sputum production and shortness of breath.   Cardiovascular: Negative for chest pain, palpitations and leg swelling.  Gastrointestinal: Negative for abdominal pain, blood in stool, constipation, diarrhea, nausea and vomiting.  Genitourinary: Negative for dysuria and hematuria.  Musculoskeletal: Positive for back pain, joint pain and neck pain.  Skin: Negative for itching and  rash.  Neurological: Negative for dizziness and headaches.  Psychiatric/Behavioral: Negative for depression. The patient is nervous/anxious. The patient does not have insomnia.      Objective  Vitals:   04/15/17 0914  BP: 118/68  Pulse: 86  Resp: 14  Temp: 98 F (36.7 C)  TempSrc: Oral  SpO2: 98%  Weight: 156 lb 9.6 oz (71 kg)  Height: 5' 4.5" (1.638 m)    Physical Exam  Constitutional: She is oriented to person, place, and time and well-developed, well-nourished, and in no distress.  HENT:  Head: Normocephalic and atraumatic.  Right Ear: External ear normal.  Left Ear: External ear normal.  Mouth/Throat: Oropharynx is clear and moist.  Eyes: Pupils are equal, round, and reactive to light.  Neck: Neck supple. No thyromegaly present.  Cardiovascular: Normal rate, regular rhythm and normal heart sounds.  No murmur heard. Pulmonary/Chest: Effort normal and breath sounds normal. She has no wheezes.  Abdominal: Soft. Bowel sounds are normal.  Musculoskeletal: She exhibits no edema.  Neurological: She is alert and oriented to person, place, and time.  Psychiatric: Mood, memory, affect and judgment normal.  Nursing note and vitals reviewed.       Assessment & Plan  1. Well woman exam (no gynecological exam) Obtain age-appropriate laboratory screening - TSH - VITAMIN D 25 Hydroxy (Vit-D Deficiency, Fractures) - CBC with Differential/Platelet  2. Morning joint stiffness of multiple sites Unlikely to be rheumatoid arthritis, suspect fibromyalgia, however will obtain labs for evaluation - Rheumatoid Factor - ANA Direct w/Reflex if Positive  3. Elevated liver enzymes  - COMPLETE METABOLIC PANEL WITH GFR  4. Behavior-irritability Reviewed notes from psychiatry, continue on Cymbalta - DULoxetine (CYMBALTA) 20 MG capsule; Take 1 capsule (20 mg total) by mouth 2 (two) times daily.  Dispense: 90 capsule; Refill: 0   Savannah Richards [facility] 04/15/2017 9:43 AM

## 2017-04-18 LAB — CBC WITH DIFFERENTIAL/PLATELET
BASOS ABS: 27 {cells}/uL (ref 0–200)
BASOS PCT: 0.4 %
EOS PCT: 1.8 %
Eosinophils Absolute: 121 cells/uL (ref 15–500)
HCT: 38.1 % (ref 35.0–45.0)
Hemoglobin: 12.6 g/dL (ref 11.7–15.5)
Lymphs Abs: 1675 cells/uL (ref 850–3900)
MCH: 29.2 pg (ref 27.0–33.0)
MCHC: 33.1 g/dL (ref 32.0–36.0)
MCV: 88.4 fL (ref 80.0–100.0)
MONOS PCT: 9.1 %
MPV: 12.1 fL (ref 7.5–12.5)
NEUTROS ABS: 4268 {cells}/uL (ref 1500–7800)
Neutrophils Relative %: 63.7 %
Platelets: 246 10*3/uL (ref 140–400)
RBC: 4.31 10*6/uL (ref 3.80–5.10)
RDW: 12 % (ref 11.0–15.0)
Total Lymphocyte: 25 %
WBC mixed population: 610 cells/uL (ref 200–950)
WBC: 6.7 10*3/uL (ref 3.8–10.8)

## 2017-04-18 LAB — TSH: TSH: 1.15 m[IU]/L

## 2017-04-18 LAB — COMPLETE METABOLIC PANEL WITH GFR
AG Ratio: 1.3 (calc) (ref 1.0–2.5)
ALT: 21 U/L (ref 6–29)
AST: 21 U/L (ref 10–30)
Albumin: 4.2 g/dL (ref 3.6–5.1)
Alkaline phosphatase (APISO): 75 U/L (ref 33–115)
BILIRUBIN TOTAL: 0.3 mg/dL (ref 0.2–1.2)
BUN: 18 mg/dL (ref 7–25)
CHLORIDE: 104 mmol/L (ref 98–110)
CO2: 28 mmol/L (ref 20–32)
Calcium: 9.5 mg/dL (ref 8.6–10.2)
Creat: 0.54 mg/dL (ref 0.50–1.10)
GFR, Est African American: 138 mL/min/{1.73_m2} (ref >=60)
GFR, Est Non African American: 119 mL/min/{1.73_m2} (ref >=60)
GLUCOSE: 95 mg/dL (ref 65–99)
Globulin: 3.2 g/dL (calc) (ref 1.9–3.7)
POTASSIUM: 4.4 mmol/L (ref 3.5–5.3)
Sodium: 138 mmol/L (ref 135–146)
Total Protein: 7.4 g/dL (ref 6.1–8.1)

## 2017-04-18 LAB — VITAMIN D 25 HYDROXY (VIT D DEFICIENCY, FRACTURES): Vit D, 25-Hydroxy: 18 ng/mL — ABNORMAL LOW (ref 30–100)

## 2017-04-18 LAB — RHEUMATOID FACTOR: Rhuematoid fact SerPl-aCnc: <14 IU/mL (ref <14)

## 2017-04-18 LAB — ANTI-NUCLEAR AB-TITER (ANA TITER): ANA Titer 1: 1:80 {titer} — ABNORMAL HIGH

## 2017-04-18 LAB — ANA: ANA: POSITIVE — AB

## 2017-04-19 ENCOUNTER — Encounter: Payer: Self-pay | Admitting: Family Medicine

## 2017-04-21 ENCOUNTER — Other Ambulatory Visit: Payer: Self-pay

## 2017-04-21 DIAGNOSIS — E559 Vitamin D deficiency, unspecified: Secondary | ICD-10-CM

## 2017-04-21 MED ORDER — VITAMIN D (ERGOCALCIFEROL) 1.25 MG (50000 UNIT) PO CAPS: 50000.0000 [IU] | ORAL_CAPSULE | ORAL | 0 refills | Status: DC

## 2017-04-25 ENCOUNTER — Encounter: Payer: Self-pay | Admitting: Family Medicine

## 2017-04-29 ENCOUNTER — Telehealth: Payer: Self-pay | Admitting: Family Medicine

## 2017-04-29 ENCOUNTER — Other Ambulatory Visit: Payer: Self-pay | Admitting: Family Medicine

## 2017-04-29 DIAGNOSIS — M2550 Pain in unspecified joint: Secondary | ICD-10-CM

## 2017-04-29 NOTE — Telephone Encounter (Signed)
Placed referral  

## 2017-04-29 NOTE — Telephone Encounter (Signed)
Pt called in to follow up on Rheumatology referral. Not showing a referral in system. Please advise

## 2017-04-29 NOTE — Telephone Encounter (Signed)
Referral has been sent to Dr. Renard MatterBock as requested.  Copied from CRM [phone]. Topic: Referral - Request >> Apr 29, 2017  9:03 AM Herby AbrahamJohnson, Shiquita C wrote: Reason for CRM: Pt called in to follow up on Rheumatology referral. Not showing a referral in system. Please advise    >> Apr 29, 2017 12:09 PM Cipriano BunkerLambe, Annette S wrote: She does Not want to see Dr. Gavin [facility]le   But she will see the other doctor in the office if needed.

## 2017-05-09 ENCOUNTER — Encounter: Payer: Self-pay | Admitting: Family Medicine

## 2017-05-09 NOTE — Progress Notes (Signed)
Patient ID: Savannah Richards, female   DOB: 1975-06-30, 42 y.o.   MRN: [id number] ANNUAL PREVENTATIVE CARE GYN  ENCOUNTER NOTE  Subjective:       Savannah Richards is a 42 y.o. [phone] female here for a routine annual gynecologic exam.  Current complaints:  1. None  Bowel function is normal. Bladder function is normal. Patient is status post micro discectomy in June for bulging disc; excellent recovery with normal function of left leg at this time following physical therapy.    Gynecologic History No LMP recorded. Patient has had a hysterectomy.status post TVH Contraception: status post hysterectomystatus post TVH Last Pap: 01/2014 neg/neg. Results were: normal Last mammogram: n/a.  History of endometriosis; Currently asymptomatic  Obstetric History OB History  Gravida Para Term Preterm AB Living  4 3 3   1 3   SAB TAB Ectopic Multiple Live Births  1       3    # Outcome Date GA Lbr Len/2nd Weight Sex Delivery Anes PTL Lv  4 Term 2011   8 lb 1.8 oz (3.679 kg) M CS-LTranv   LIV  3 Term 2010   8 lb 1.8 oz (3.679 kg)  CS-LTranv   LIV  2 SAB           1 Term    8 lb 1.9 oz (3.683 kg) M Vag-Spont   LIV      Past Medical History:  Diagnosis Date  . Anxiety   . Dyspareunia   . Elevated hemoglobin (HCC)   . Elevated lipids   . Endometriosis   . GERD (gastroesophageal reflux disease)   . Hematuria   . Herniated cervical disc   . Hypertension   . IBS (irritable bowel syndrome)   . Infertility, female   . Irregular periods   . Ovarian cyst   . Pelvic adhesions   . Urinary urgency   . Vaginal discharge   . Vaginal Pap smear, abnormal     Past Surgical History:  Procedure Laterality Date  . CESAREAN SECTION     x 2  . HERNIA REPAIR     umbilicus  . MICRODISCECTOMY LUMBAR  08/19/2016   L4-L5  . MOLE REMOVAL     multiple  . VAGINAL HYSTERECTOMY  10/2011    Current Outpatient Medications on File Prior to Visit  Medication Sig Dispense Refill  . cetirizine  (ZYRTEC) 10 MG tablet Take 10 mg by mouth as needed.     . dicyclomine (BENTYL) 10 MG capsule Take 10 mg by mouth as needed.     . DULoxetine (CYMBALTA) 20 MG capsule Take 1 capsule (20 mg total) by mouth 2 (two) times daily. 90 capsule 0  . telmisartan (MICARDIS) 80 MG tablet Take 1 tablet (80 mg total) by mouth daily. 90 tablet 0  . Vitamin D, Ergocalciferol, (DRISDOL) 50000 units CAPS capsule Take 1 capsule (50,000 Units total) by mouth every 7 (seven) days. For 12 weeks. 12 capsule 0   No current facility-administered medications on file prior to visit.     Allergies  Allergen Reactions  . Bactrim [Sulfamethoxazole-Trimethoprim]   . Ciprofloxacin Hcl   . Shellfish Allergy     Social History   Socioeconomic History  . Marital status: Married    Spouse name: Not on file  . Number of children: Not on file  . Years of education: Not on file  . Highest education level: Not on file  Social Needs  . Financial resource strain: Not on  file  . Food insecurity - worry: Not on file  . Food insecurity - inability: Not on file  . Transportation needs - medical: Not on file  . Transportation needs - non-medical: Not on file  Occupational History  . Not on file  Tobacco Use  . Smoking status: Never Smoker  . Smokeless tobacco: Never Used  Substance and Sexual Activity  . Alcohol use: No    Alcohol/week: 0.0 oz  . Drug use: No  . Sexual activity: Yes    Partners: Female    Birth control/protection: Surgical  Other Topics Concern  . Not on file  Social History Narrative  . Not on file    Family History  Problem Relation Age of Onset  . Osteoporosis Mother   . Endometriosis Mother   . Hypertension Mother   . Diabetes Father   . Heart disease Father   . Hypertension Father   . Thyroid disease Sister   . Hypertension Brother   . Hyperlipidemia Brother   . Diabetes Maternal Grandfather   . Colon cancer Paternal Grandmother   . Diabetes Paternal Grandfather   . Stroke  Maternal Grandmother   . Breast cancer Neg Hx   . Ovarian cancer Neg Hx     The following portions of the patient's history were reviewed and updated as appropriate: allergies, current medications, past family history, past medical history, past social history, past surgical history and problem list.  Review of Systems Review of Systems  Constitutional: Negative.   HENT: Negative.   Eyes: Negative.   Respiratory: Negative.   Cardiovascular: Negative.   Gastrointestinal: Negative.   Genitourinary: Negative.   Musculoskeletal: Negative.   Skin: Negative.   Neurological: Negative.   Endo/Heme/Allergies: Negative.   Psychiatric/Behavioral: Negative.     Objective:   BP 119/78   Pulse 86   Ht 5\' 4"  (1.626 m)   Wt 159 lb (72.1 kg)   BMI 27.29 kg/m  ONSTITUTIONAL: Well-developed, well-nourished female in no acute distress.  PSYCHIATRIC: Normal mood and affect. Normal behavior. Normal judgment and thought content. NEUROLGIC: Alert and oriented to person, place, and time. Normal muscle tone coordination. No cranial nerve deficit noted. HENT:  Normocephalic, atraumatic, External right and left ear normal. Oropharynx is clear and moist EYES: Conjunctivae and EOM are normal. No scleral icterus.  NECK: Normal range of motion, supple, no masses.  Normal thyroid.  SKIN: Skin is warm and dry. No rash noted. Not diaphoretic. No erythema. No pallor. CARDIOVASCULAR: Normal heart rate noted, regular rhythm, no murmur. RESPIRATORY: Clear to auscultation bilaterally. Effort and breath sounds normal, no problems with respiration noted. BREASTS: Symmetric in size. No masses, skin changes, nipple drainage, or lymphadenopathy. ABDOMEN: Soft, normal bowel sounds, no distention noted.  No tenderness, rebound or guarding.  BLADDER: Normal PELVIC:  External Genitalia: Normal  BUS: Normal  Vagina: Normal; excellent estrogen effect; good vault support; vaginal cuff intact  Cervix: surgically  absent  Uterus: surgically absent  Adnexa: Normal; no masses; no tenderness  RV: External Exam NormaI  MUSCULOSKELETAL: Normal range of motion. No tenderness.  No cyanosis, clubbing, or edema.  2+ distal pulses. LYMPHATIC: No Axillary, Supraclavicular, or Inguinal Adenopathy.    Assessment:   Annual gynecologic examination 42 y.o. Contraception: tubal ligation Normal BMI Hypertension. Anxiety, stable Status post TVH  Plan:  Pap: Not needed Mammogram: Not Indicated Stool Guaiac Testing:  Not Indicated Labs: thru pcp- Dr. Sherryll BurgerShah Routine preventative health maintenance measures emphasized: Exercise/Diet/Weight control, Tobacco Warnings and Alcohol/Substance use risks  Return to Clinic - [address] [place], [place]  Herold Harms, MD   Note: This dictation was prepared with Dragon dictation along with smaller phrase technology. Any transcriptional errors that result from this process are unintentional.

## 2017-05-11 ENCOUNTER — Encounter: Payer: Self-pay | Admitting: Obstetrics and Gynecology

## 2017-05-11 ENCOUNTER — Ambulatory Visit (INDEPENDENT_AMBULATORY_CARE_PROVIDER_SITE_OTHER): Payer: Federal, State, Local not specified - PPO | Admitting: Obstetrics and Gynecology

## 2017-05-11 VITALS — BP 119/78 | HR 86 | Ht 64.0 in | Wt 159.0 lb

## 2017-05-11 DIAGNOSIS — N809 Endometriosis, unspecified: Secondary | ICD-10-CM | POA: Insufficient documentation

## 2017-05-11 DIAGNOSIS — Z01419 Encounter for gynecological examination (general) (routine) without abnormal findings: Secondary | ICD-10-CM

## 2017-05-11 DIAGNOSIS — Z90710 Acquired absence of both cervix and uterus: Secondary | ICD-10-CM | POA: Diagnosis not present

## 2017-05-11 NOTE — Patient Instructions (Signed)
1.  No Pap smear needed 2.  Self breast awareness is encouraged 3.  Screening labs are to be obtained through primary care 4.  Continue with healthy eating and exercise 5.  Return in 1 year for annual exam  Health Maintenance, Female Adopting a healthy lifestyle and getting preventive care can go a long way to promote health and wellness. Talk with your health care provider about what schedule of regular examinations is right for you. This is a good chance for you to check in with your provider about disease prevention and staying healthy. In between checkups, there are plenty of things you can do on your own. Experts have done a lot of research about which lifestyle changes and preventive measures are most likely to keep you healthy. Ask your health care provider for more information. Weight and diet Eat a healthy diet  Be sure to include plenty of vegetables, fruits, low-fat dairy products, and lean protein.  Do not eat a lot of foods high in solid fats, added sugars, or salt.  Get regular exercise. This is one of the most important things you can do for your health. ? Most adults should exercise for at least 150 minutes each week. The exercise should increase your heart rate and make you sweat (moderate-intensity exercise). ? Most adults should also do strengthening exercises at least twice a week. This is in addition to the moderate-intensity exercise.  Maintain a healthy weight  Body mass index (BMI) is a measurement that can be used to identify possible weight problems. It estimates body fat based on height and weight. Your health care provider can help determine your BMI and help you achieve or maintain a healthy weight.  For females 62 years of age and older: ? A BMI below 18.5 is considered underweight. ? A BMI of 18.5 to 24.9 is normal. ? A BMI of 25 to 29.9 is considered overweight. ? A BMI of 30 and above is considered obese.  Watch levels of cholesterol and blood  lipids  You should start having your blood tested for lipids and cholesterol at 40 years of age, then have this test every 5 years.  You may need to have your cholesterol levels checked more often if: ? Your lipid or cholesterol levels are high. ? You are older than 40 years of age. ? You are at high risk for heart disease.  Cancer screening Lung Cancer  Lung cancer screening is recommended for adults 58-82 years old who are at high risk for lung cancer because of a history of smoking.  A yearly low-dose CT scan of the lungs is recommended for people who: ? Currently smoke. ? Have quit within the past 15 years. ? Have at least a 30-pack-year history of smoking. A pack year is smoking an average of one pack of cigarettes a day for 1 year.  Yearly screening should continue until it has been 15 years since you quit.  Yearly screening should stop if you develop a health problem that would prevent you from having lung cancer treatment.  Breast Cancer  Practice breast self-awareness. This means understanding how your breasts normally appear and feel.  It also means doing regular breast self-exams. Let your health care provider know about any changes, no matter how small.  If you are in your 20s or 30s, you should have a clinical breast exam (CBE) by a health care provider every 1-3 years as part of a regular health exam.  If you are 40  or older, have a CBE every year. Also consider having a breast X-ray (mammogram) every year.  If you have a family history of breast cancer, talk to your health care provider about genetic screening.  If you are at high risk for breast cancer, talk to your health care provider about having an MRI and a mammogram every year.  Breast cancer gene (BRCA) assessment is recommended for women who have family members with BRCA-related cancers. BRCA-related cancers include: ? Breast. ? Ovarian. ? Tubal. ? Peritoneal cancers.  Results of the assessment will  determine the need for genetic counseling and BRCA1 and BRCA2 testing.  Cervical Cancer Your health care provider may recommend that you be screened regularly for cancer of the pelvic organs (ovaries, uterus, and vagina). This screening involves a pelvic examination, including checking for microscopic changes to the surface of your cervix (Pap test). You may be encouraged to have this screening done every 3 years, beginning at age 25.  For women ages 79-65, health care providers may recommend pelvic exams and Pap testing every 3 years, or they may recommend the Pap and pelvic exam, combined with testing for human papilloma virus (HPV), every 5 years. Some types of HPV increase your risk of cervical cancer. Testing for HPV may also be done on women of any age with unclear Pap test results.  Other health care providers may not recommend any screening for nonpregnant women who are considered low risk for pelvic cancer and who do not have symptoms. Ask your health care provider if a screening pelvic exam is right for you.  If you have had past treatment for cervical cancer or a condition that could lead to cancer, you need Pap tests and screening for cancer for at least 20 years after your treatment. If Pap tests have been discontinued, your risk factors (such as having a new sexual partner) need to be reassessed to determine if screening should resume. Some women have medical problems that increase the chance of getting cervical cancer. In these cases, your health care provider may recommend more frequent screening and Pap tests.  Colorectal Cancer  This type of cancer can be detected and often prevented.  Routine colorectal cancer screening usually begins at 40 years of age and continues through 40 years of age.  Your health care provider may recommend screening at an earlier age if you have risk factors for colon cancer.  Your health care provider may also recommend using home test kits to check  for hidden blood in the stool.  A small camera at the end of a tube can be used to examine your colon directly (sigmoidoscopy or colonoscopy). This is done to check for the earliest forms of colorectal cancer.  Routine screening usually begins at age 71.  Direct examination of the colon should be repeated every 5-10 years through 40 years of age. However, you may need to be screened more often if early forms of precancerous polyps or small growths are found.  Skin Cancer  Check your skin from head to toe regularly.  Tell your health care provider about any new moles or changes in moles, especially if there is a change in a mole's shape or color.  Also tell your health care provider if you have a mole that is larger than the size of a pencil eraser.  Always use sunscreen. Apply sunscreen liberally and repeatedly throughout the day.  Protect yourself by wearing long sleeves, pants, a wide-brimmed hat, and sunglasses whenever you are  outside.  Heart disease, diabetes, and high blood pressure  High blood pressure causes heart disease and increases the risk of stroke. High blood pressure is more likely to develop in: ? People who have blood pressure in the high end of the normal range (130-139/85-89 mm Hg). ? People who are overweight or obese. ? People who are African American.  If you are 48-51 years of age, have your blood pressure checked every 3-5 years. If you are 6 years of age or older, have your blood pressure checked every year. You should have your blood pressure measured twice-once when you are at a hospital or clinic, and once when you are not at a hospital or clinic. Record the average of the two measurements. To check your blood pressure when you are not at a hospital or clinic, you can use: ? An automated blood pressure machine at a pharmacy. ? A home blood pressure monitor.  If you are between 66 years and 88 years old, ask your health care provider if you should take  aspirin to prevent strokes.  Have regular diabetes screenings. This involves taking a blood sample to check your fasting blood sugar level. ? If you are at a normal weight and have a low risk for diabetes, have this test once every three years after 40 years of age. ? If you are overweight and have a high risk for diabetes, consider being tested at a younger age or more often. Preventing infection Hepatitis B  If you have a higher risk for hepatitis B, you should be screened for this virus. You are considered at high risk for hepatitis B if: ? You were born in a country where hepatitis B is common. Ask your health care provider which countries are considered high risk. ? Your parents were born in a high-risk country, and you have not been immunized against hepatitis B (hepatitis B vaccine). ? You have HIV or AIDS. ? You use needles to inject street drugs. ? You live with someone who has hepatitis B. ? You have had sex with someone who has hepatitis B. ? You get hemodialysis treatment. ? You take certain medicines for conditions, including cancer, organ transplantation, and autoimmune conditions.  Hepatitis C  Blood testing is recommended for: ? Everyone born from [birth year] through 1963. ? Anyone with known risk factors for hepatitis C.  Sexually transmitted infections (STIs)  You should be screened for sexually transmitted infections (STIs) including gonorrhea and chlamydia if: ? You are sexually active and are younger than 40 years of age. ? You are older than 40 years of age and your health care provider tells you that you are at risk for this type of infection. ? Your sexual activity has changed since you were last screened and you are at an increased risk for chlamydia or gonorrhea. Ask your health care provider if you are at risk.  If you do not have HIV, but are at risk, it may be recommended that you take a prescription medicine daily to prevent HIV infection. This is called  pre-exposure prophylaxis (PrEP). You are considered at risk if: ? You are sexually active and do not regularly use condoms or know the HIV status of your partner(s). ? You take drugs by injection. ? You are sexually active with a partner who has HIV.  Talk with your health care provider about whether you are at high risk of being infected with HIV. If you choose to begin PrEP, you should first be tested  for HIV. You should then be tested every 3 months for as long as you are taking PrEP. Pregnancy  If you are premenopausal and you may become pregnant, ask your health care provider about preconception counseling.  If you may become pregnant, take 400 to 800 micrograms (mcg) of folic acid every day.  If you want to prevent pregnancy, talk to your health care provider about birth control (contraception). Osteoporosis and menopause  Osteoporosis is a disease in which the bones lose minerals and strength with aging. This can result in serious bone fractures. Your risk for osteoporosis can be identified using a bone density scan.  If you are 60 years of age or older, or if you are at risk for osteoporosis and fractures, ask your health care provider if you should be screened.  Ask your health care provider whether you should take a calcium or vitamin D supplement to lower your risk for osteoporosis.  Menopause may have certain physical symptoms and risks.  Hormone replacement therapy may reduce some of these symptoms and risks. Talk to your health care provider about whether hormone replacement therapy is right for you. Follow these instructions at home:  Schedule regular health, dental, and eye exams.  Stay current with your immunizations.  Do not use any tobacco products including cigarettes, chewing tobacco, or electronic cigarettes.  If you are pregnant, do not drink alcohol.  If you are breastfeeding, limit how much and how often you drink alcohol.  Limit alcohol intake to no more  than 1 drink per day for nonpregnant women. One drink equals 12 ounces of beer, 5 ounces of wine, or 1 ounces of hard liquor.  Do not use street drugs.  Do not share needles.  Ask your health care provider for help if you need support or information about quitting drugs.  Tell your health care provider if you often feel depressed.  Tell your health care provider if you have ever been abused or do not feel safe at home. This information is not intended to replace advice given to you by your health care provider. Make sure you discuss any questions you have with your health care provider. Document Released: 09/07/2010 Document Revised: 07/31/2015 Document Reviewed: 11/26/2014 Elsevier Interactive Patient Education  [employer].

## 2017-05-16 DIAGNOSIS — R768 Other specified abnormal immunological findings in serum: Secondary | ICD-10-CM | POA: Diagnosis not present

## 2017-05-16 DIAGNOSIS — M791 Myalgia, unspecified site: Secondary | ICD-10-CM | POA: Diagnosis not present

## 2017-05-16 DIAGNOSIS — M25542 Pain in joints of left hand: Secondary | ICD-10-CM | POA: Diagnosis not present

## 2017-05-16 DIAGNOSIS — Z9889 Other specified postprocedural states: Secondary | ICD-10-CM | POA: Diagnosis not present

## 2017-05-16 DIAGNOSIS — M25541 Pain in joints of right hand: Secondary | ICD-10-CM | POA: Diagnosis not present

## 2017-07-06 ENCOUNTER — Other Ambulatory Visit: Payer: Self-pay | Admitting: Family Medicine

## 2017-07-06 ENCOUNTER — Telehealth: Payer: Self-pay | Admitting: Family Medicine

## 2017-07-06 DIAGNOSIS — R454 Irritability and anger: Secondary | ICD-10-CM

## 2017-07-06 NOTE — Telephone Encounter (Signed)
Copied from CRM [id number]. Topic: Quick Communication - Rx Refill/Question >> Jul 06, 2017 10:48 AM Eston Mould B wrote: Medication: DULoxetine (CYMBALTA) 20 MG capsule      Has the patient contacted their pharmacy? Yes   (Agent: If no, request that the patient contact the pharmacy for the refill.)  Preferred Pharmacy (with phone number or street name): [facility], [place] - [place], [place] - [address] [phone] (Phone) [phone] (Fax)      Agent: Please be advised that RX refills may take up to 3 business days. We ask that you follow-up with your pharmacy.

## 2017-07-06 NOTE — Telephone Encounter (Signed)
Refill request for general medication. Duloxetine to [facility]  Last office visit: 04/15/2017   Follow up 07/19/2017

## 2017-07-19 ENCOUNTER — Ambulatory Visit (INDEPENDENT_AMBULATORY_CARE_PROVIDER_SITE_OTHER): Payer: Federal, State, Local not specified - PPO | Admitting: Family Medicine

## 2017-07-19 ENCOUNTER — Encounter: Payer: Self-pay | Admitting: Family Medicine

## 2017-07-19 VITALS — BP 100/80 | HR 79 | Resp 16 | Ht 64.0 in | Wt 157.4 lb

## 2017-07-19 DIAGNOSIS — I358 Other nonrheumatic aortic valve disorders: Secondary | ICD-10-CM | POA: Diagnosis not present

## 2017-07-19 DIAGNOSIS — E559 Vitamin D deficiency, unspecified: Secondary | ICD-10-CM

## 2017-07-19 DIAGNOSIS — R768 Other specified abnormal immunological findings in serum: Secondary | ICD-10-CM

## 2017-07-19 DIAGNOSIS — R7303 Prediabetes: Secondary | ICD-10-CM

## 2017-07-19 DIAGNOSIS — K580 Irritable bowel syndrome with diarrhea: Secondary | ICD-10-CM | POA: Diagnosis not present

## 2017-07-19 DIAGNOSIS — M2550 Pain in unspecified joint: Secondary | ICD-10-CM

## 2017-07-19 DIAGNOSIS — R454 Irritability and anger: Secondary | ICD-10-CM | POA: Diagnosis not present

## 2017-07-19 DIAGNOSIS — F411 Generalized anxiety disorder: Secondary | ICD-10-CM | POA: Diagnosis not present

## 2017-07-19 DIAGNOSIS — I10 Essential (primary) hypertension: Secondary | ICD-10-CM | POA: Diagnosis not present

## 2017-07-19 DIAGNOSIS — E7800 Pure hypercholesterolemia, unspecified: Secondary | ICD-10-CM

## 2017-07-19 MED ORDER — DULOXETINE HCL 60 MG PO CPEP: 60.0000 mg | ORAL_CAPSULE | Freq: Every day | ORAL | 0 refills | Status: DC

## 2017-07-19 MED ORDER — VITAMIN D 50 MCG (2000 UT) PO CAPS: 1.0000 | ORAL_CAPSULE | Freq: Every day | ORAL | 0 refills | Status: DC

## 2017-07-19 MED ORDER — LOSARTAN POTASSIUM 100 MG PO TABS: 100.0000 mg | ORAL_TABLET | Freq: Every day | ORAL | 0 refills | Status: DC

## 2017-07-19 MED ORDER — HYDROXYZINE HCL 10 MG PO TABS: 10.0000 mg | ORAL_TABLET | Freq: Three times a day (TID) | ORAL | 0 refills | Status: DC | PRN

## 2017-07-19 NOTE — Progress Notes (Signed)
Name: Lacreshia Bondarenko   MRN: [id number]    DOB: 06-Jul-1975   Date:07/19/2017       Progress Note  Subjective  Chief Complaint  Chief Complaint  Patient presents with  . Hypertension  . Hyperlipidemia  . prediabetes    HPI  HTN: had multiple tests done, including for rule out of hyperaldosteronism or pheochromocytoma. BP is low since switched from Losartan to Micardis but is feeling tired and would like to go back on Losartan. No chest pain or palpitation  Hyperlipidemia: last LDL was 186, not on medication, positive family history of heart disease, maternal grandmother had a stroke in her 3's, maternal uncles MI's in their 14's. Mother has HTN and hyperlipidemia.   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 She states currently doing well, takes Bentyl very seldom, Bristol scale of 4, no abdominal cramping at this time, and not missing work. She states having children, hysterectomy and cymbalta seems to have helped with symptoms.   Arthralgia and muscle aches: positive ANA< seen by Dr. Renard Matter but given reassurance and advised tylenol and ibuprofen prn, we will check for inflammation. 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    Patient Active Problem List   Diagnosis Date Noted  . Positive ANA (antinuclear antibody) 05/16/2017  . History of total vaginal hysterectomy (TVH) 05/11/2017  . History of back surgery 08/19/2016  . Aortic valve sclerosis 08/03/2016  . Gastroesophageal reflux disease 07/23/2016  . Essential hypertension, benign 07/08/2015  . Vitamin D insufficiency 05/28/2015  . Cardiac murmur 02/18/2015  . Prediabetes 12/11/2014  . Hyperlipidemia 12/11/2014  . Anxiety 10/30/2014    Past Surgical History:   Procedure Laterality Date  . CESAREAN SECTION     x 2  . HERNIA REPAIR     umbilicus  . MICRODISCECTOMY LUMBAR  08/19/2016   L4-L5  . MOLE REMOVAL     multiple  . VAGINAL HYSTERECTOMY  10/2011    Family History  Problem Relation Age of Onset  . Osteoporosis Mother   . Endometriosis Mother   . Hypertension Mother   . Diabetes Father   . Heart disease Father   . Hypertension Father   . Thyroid disease Sister   . Hypertension Brother   . Hyperlipidemia Brother   . Diabetes Maternal Grandfather   . Colon cancer Paternal Grandmother   . Diabetes Paternal Grandfather   . Stroke Maternal Grandmother   . Breast cancer Neg Hx   . Ovarian cancer Neg Hx     Social History   Socioeconomic History  . Marital status: Married    Spouse name: Not on file  . Number of children: Not on file  . Years of education: Not on file  . Highest education level: Not on file  Occupational History  . Not on file  Social Needs  . Financial resource strain: Not on file  . Food insecurity:    Worry: Not on file    Inability: Not on file  . Transportation needs:    Medical: Not on file    Non-medical: Not on file  Tobacco Use  . Smoking status: Never Smoker  . Smokeless tobacco: Never Used  Substance and Sexual Activity  . Alcohol use:  No    Alcohol/week: 0.0 oz  . Drug use: No  . Sexual activity: Yes    Partners: Female    Birth control/protection: Surgical  Lifestyle  . Physical activity:    Days per week: 0 days    Minutes per session: 0 min  . Stress: Not on file  Relationships  . Social connections:    Talks on phone: Not on file    Gets together: Not on file    Attends religious service: Not on file    Active member of club or organization: Not on file    Attends meetings of clubs or organizations: Not on file    Relationship status: Not on file  . Intimate partner violence:    Fear of current or ex partner: Not on file    Emotionally abused: Not on file    Physically  abused: Not on file    Forced sexual activity: Not on file  Other Topics Concern  . Not on file  Social History Narrative  . Not on file     Current Outpatient Medications:  .  dicyclomine (BENTYL) 10 MG capsule, Take 10 mg by mouth as needed. , Disp: , Rfl:  .  DULoxetine (CYMBALTA) 60 MG capsule, Take 1 capsule (60 mg total) by mouth daily., Disp: 90 capsule, Rfl: 0 .  Cholecalciferol (VITAMIN D) 2000 units CAPS, Take 1 capsule (2,000 Units total) by mouth daily., Disp: 30 capsule, Rfl: 0 .  hydrOXYzine (ATARAX/VISTARIL) 10 MG tablet, Take 1-2 tablets (10-20 mg total) by mouth 3 (three) times daily as needed., Disp: 30 tablet, Rfl: 0 .  losartan (COZAAR) 100 MG tablet, Take 1 tablet (100 mg total) by mouth daily., Disp: 90 tablet, Rfl: 0  Allergies  Allergen Reactions  . Ciprofloxacin Hcl Other (See Comments)  . Shellfish Allergy   . Sulfamethoxazole-Trimethoprim Other (See Comments)     ROS  Constitutional: Negative for fever or weight change.  Respiratory: Negative for cough and shortness of breath.   Cardiovascular: Negative for chest pain or palpitations.  Gastrointestinal: Positive  for intermittent abdominal pain, current normal bowel movements Musculoskeletal: Negative for gait problem or joint swelling.  Skin: Negative for rash.  Neurological: Negative for dizziness or headache.  No other specific complaints in a complete review of systems (except as listed in HPI above).  Objective  Vitals:   07/19/17 1010  BP: 100/80  Pulse: 79  Resp: 16  SpO2: 98%  Weight: 157 lb 6.4 oz (71.4 kg)  Height:  (1.626 m)    Body mass index is 27.02 kg/m.  Physical Exam  Constitutional: Patient appears well-developed and well-nourished. Overweight  No distress.  HEENT: head atraumatic, normocephalic, pupils equal and reactive to light,  neck supple, throat within normal limits Cardiovascular: Normal rate, regular rhythm and normal heart sounds.  No murmur heard. No  BLE edema. Pulmonary/Chest: Effort normal and breath sounds normal. No respiratory distress. Abdominal: Soft.  There is no tenderness. Psychiatric: Patient has a normal mood and affect. behavior is normal. Judgment and thought content normal. Muscular Skeletal: no synovitis, trigger point positive throughout   PHQ2/9: Depression screen [facility] 2/9 07/19/2017 04/15/2017 03/24/2017 12/31/2016 05/25/2016  Decreased Interest 0 0 0 0 0  Down, Depressed, Hopeless 0 0 0 0 0  PHQ - 2 Score 0 0 0 0 0  Altered sleeping 0 - - - -  Tired, decreased energy 2 - - - -  Change in appetite 0 - - - -  Feeling bad or failure about yourself  0 - - - -  Trouble concentrating 0 - - - -  Moving slowly or fidgety/restless 0 - - - -  Suicidal thoughts 0 - - - -  PHQ-9 Score 2 - - - -  Difficult doing work/chores Not difficult at all - - - -     Fall Risk: Fall Risk  07/19/2017 04/15/2017 03/24/2017 12/31/2016 05/25/2016  Falls in the past year? Yes No No No No  Number falls in past yr: 1 - - - -  Injury with Fall? Yes - - - -  Follow up Falls evaluation completed - - - -     Assessment & Plan  1. Irritability  - DULoxetine (CYMBALTA) 60 MG capsule; Take 1 capsule (60 mg total) by mouth daily.  Dispense: 90 capsule; Refill: 0  2. Vitamin D deficiency  - Cholecalciferol (VITAMIN D) 2000 units CAPS; Take 1 capsule (2,000 Units total) by mouth daily.  Dispense: 30 capsule; Refill: 0  3. Essential hypertension, benign  - losartan (COZAAR) 100 MG tablet; Take 1 tablet (100 mg total) by mouth daily.  Dispense: 90 tablet; Refill: 0 - COMPLETE METABOLIC PANEL WITH GFR  4. Prediabetes  - Hemoglobin A1c  5. Pure hypercholesterolemia  - Lipid panel  6. Positive ANA (antinuclear antibody)  Seen by Dr. Renard Matter   7. Aortic valve sclerosis   8. Arthralgia, unspecified joint  Seen by Dr. Renard Matter, we will check for inflammation, it may be FMS - C-reactive protein - Sedimentation rate Possible FMS  9. GAD  (generalized anxiety disorder)  - hydrOXYzine (ATARAX/VISTARIL) 10 MG tablet; Take 1-2 tablets (10-20 mg total) by mouth 3 (three) times daily as needed.  Dispense: 30 tablet; Refill: 0

## 2017-07-21 ENCOUNTER — Encounter: Payer: Self-pay | Admitting: Family Medicine

## 2017-07-22 ENCOUNTER — Other Ambulatory Visit: Payer: Self-pay | Admitting: Family Medicine

## 2017-07-22 MED ORDER — ROSUVASTATIN CALCIUM 20 MG PO TABS: 20.0000 mg | ORAL_TABLET | Freq: Every day | ORAL | 1 refills | Status: DC

## 2017-07-28 LAB — COMPLETE METABOLIC PANEL WITH GFR
AG Ratio: 1.2 (calc) (ref 1.0–2.5)
ALKALINE PHOSPHATASE (APISO): 83 U/L (ref 33–115)
ALT: 13 U/L (ref 6–29)
AST: 17 U/L (ref 10–30)
Albumin: 4.3 g/dL (ref 3.6–5.1)
BUN: 16 mg/dL (ref 7–25)
CALCIUM: 9.8 mg/dL (ref 8.6–10.2)
CO2: 28 mmol/L (ref 20–32)
CREATININE: 0.6 mg/dL (ref 0.50–1.10)
Chloride: 101 mmol/L (ref 98–110)
GFR, Est African American: 133 mL/min/{1.73_m2} (ref >=60)
GFR, Est Non African American: 115 mL/min/{1.73_m2} (ref >=60)
GLUCOSE: 101 mg/dL — AB (ref 65–99)
Globulin: 3.5 g/dL (calc) (ref 1.9–3.7)
Potassium: 4.3 mmol/L (ref 3.5–5.3)
Sodium: 136 mmol/L (ref 135–146)
Total Bilirubin: 0.5 mg/dL (ref 0.2–1.2)
Total Protein: 7.8 g/dL (ref 6.1–8.1)

## 2017-07-28 LAB — SEDIMENTATION RATE: Sed Rate: 38 mm/h — ABNORMAL HIGH (ref 0–20)

## 2017-07-28 LAB — LIPID PANEL
CHOL/HDL RATIO: 5.5 (calc) — AB (ref <5.0)
CHOLESTEROL: 283 mg/dL — AB (ref <200)
HDL: 51 mg/dL (ref >50)
LDL Cholesterol (Calc): 207 mg/dL (calc) — ABNORMAL HIGH
Non-HDL Cholesterol (Calc): 232 mg/dL (calc) — ABNORMAL HIGH (ref <130)
TRIGLYCERIDES: 113 mg/dL (ref <150)

## 2017-07-28 LAB — C-REACTIVE PROTEIN: CRP: 2 mg/L (ref <8.0)

## 2017-07-28 LAB — HEMOGLOBIN A1C
EAG (MMOL/L): 6.3 (calc)
Hgb A1c MFr Bld: 5.6 % of total Hgb (ref <5.7)
Mean Plasma Glucose: 114 (calc)

## 2017-08-09 DIAGNOSIS — Z86018 Personal history of other benign neoplasm: Secondary | ICD-10-CM | POA: Diagnosis not present

## 2017-08-09 DIAGNOSIS — L578 Other skin changes due to chronic exposure to nonionizing radiation: Secondary | ICD-10-CM | POA: Diagnosis not present

## 2017-08-09 DIAGNOSIS — L308 Other specified dermatitis: Secondary | ICD-10-CM | POA: Diagnosis not present

## 2017-08-12 DIAGNOSIS — K080 Exfoliation of teeth due to systemic causes: Secondary | ICD-10-CM | POA: Diagnosis not present

## 2017-10-19 ENCOUNTER — Encounter: Payer: Self-pay | Admitting: Family Medicine

## 2017-10-19 ENCOUNTER — Ambulatory Visit: Payer: Federal, State, Local not specified - PPO | Admitting: Family Medicine

## 2017-10-19 VITALS — BP 118/84 | HR 70 | Temp 98.0°F | Resp 16 | Ht 64.0 in | Wt 159.7 lb

## 2017-10-19 DIAGNOSIS — R238 Other skin changes: Secondary | ICD-10-CM | POA: Diagnosis not present

## 2017-10-19 DIAGNOSIS — I358 Other nonrheumatic aortic valve disorders: Secondary | ICD-10-CM

## 2017-10-19 DIAGNOSIS — K580 Irritable bowel syndrome with diarrhea: Secondary | ICD-10-CM | POA: Diagnosis not present

## 2017-10-19 DIAGNOSIS — R7303 Prediabetes: Secondary | ICD-10-CM | POA: Diagnosis not present

## 2017-10-19 DIAGNOSIS — R233 Spontaneous ecchymoses: Secondary | ICD-10-CM

## 2017-10-19 DIAGNOSIS — Z114 Encounter for screening for human immunodeficiency virus [HIV]: Secondary | ICD-10-CM | POA: Diagnosis not present

## 2017-10-19 DIAGNOSIS — R011 Cardiac murmur, unspecified: Secondary | ICD-10-CM

## 2017-10-19 DIAGNOSIS — I10 Essential (primary) hypertension: Secondary | ICD-10-CM | POA: Diagnosis not present

## 2017-10-19 DIAGNOSIS — R768 Other specified abnormal immunological findings in serum: Secondary | ICD-10-CM

## 2017-10-19 DIAGNOSIS — E7800 Pure hypercholesterolemia, unspecified: Secondary | ICD-10-CM

## 2017-10-19 DIAGNOSIS — F411 Generalized anxiety disorder: Secondary | ICD-10-CM | POA: Diagnosis not present

## 2017-10-19 MED ORDER — LOSARTAN POTASSIUM 100 MG PO TABS: 100.0000 mg | ORAL_TABLET | Freq: Every day | ORAL | 1 refills | Status: DC

## 2017-10-19 MED ORDER — DULOXETINE HCL 60 MG PO CPEP
60.0000 mg | ORAL_CAPSULE | Freq: Every day | ORAL | 1 refills | Status: DC
Start: 2017-10-19 — End: 2018-05-03

## 2017-10-19 NOTE — Progress Notes (Signed)
Name: Savannah Richards   MRN: [id number]    DOB: [birth year]   Date:10/19/2017       Progress Note  Subjective  Chief Complaint  Chief Complaint  Patient presents with  . Hypertension  . Hyperlipidemia  . Medication Refill  . Depression    HPI  HTN: BP is at goal today and she is feeling much better on losartan (was switched to this from telmesartan due to side effects on telmesartan).  No chest pain, shortness of breath, BLE edema, vision changes, headaches, or palpitations.  Hyperlipidemia: last LDL was 207, started crestor but has not been as compliant as she would like.  She takes it at night because it causes some body cramps if she takes it in the morning. Positive family history of heart disease, maternal grandmother had a stroke in her 6150's, maternal uncles MI's in their 3240's. Mother has HTN and hyperlipidemia.   Anxiety: She has been dealing with anxiety with some mild depression since her hysterectomy (2012/2013).  She was dealing with feelings of anger which was a big change for her as she was previously a very easy-going, happy person.  She was on Lexapro initially but this made her feel numb and did not have enough emotions.  Cymbalta has  Been working well, and she has been feeling great since increasing her dose. GAD-7 score of 0, PHQ-9 score of 2 today.  She has only used 1 dose of hydroxyzine since May 2019.  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 Her last colonoscopy in 2011/2012 - [facility]ut[facility] and she reports it was negative.  She states currently doing well, takes Bentyl very, very seldom, Bristol scale of 4, no abdominal cramping at this time, and not missing work, no blood in stool, diarrhea or constipation at present. She states having children, hysterectomy and cymbalta seems to have helped with  symptoms.  Cardiac Murmur and Aortic Valve Sclerosis: Found on abnormal EKG prior to back surgery. Had echo two years ago, states does not need to have repeat for 8-9 more years.  [facility] Dr. Sol BlazingHenke May 2018. Was told she did not need to follow up for 10 years  Arthralgia and muscle aches: positive ANA; seen by Dr. Renard MatterBock but given reassurance and advised tylenol and ibuprofen prn, but this does not work for her pain. Pain episodes can last up to 3 days or longer and occurs about 2 times a month.  Pain occurs all over - she points to her chest, upper back, upper arms, and legs.  Returns to Dr. Renard MatterBock in September 2019 for follow up.  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 She also notes increased easy bruising recently - will check cbc today.  Prediabetes: we will recheck labs today, denies polyphagia, polyuria or polydipsia.   Patient Active Problem List   Diagnosis Date Noted  . GAD (generalized anxiety disorder) 10/19/2017  . Irritable bowel syndrome with diarrhea 07/19/2017  . Positive ANA (antinuclear antibody) 05/16/2017  . History of total vaginal hysterectomy (TVH) 05/11/2017  . History of back surgery 08/19/2016  . Aortic valve sclerosis 08/03/2016  . Gastroesophageal reflux disease 07/23/2016  . Essential hypertension, benign 07/08/2015  . Vitamin D insufficiency 05/28/2015  . Cardiac murmur 02/18/2015  . Prediabetes 12/11/2014  .  Hyperlipidemia 12/11/2014  . Anxiety 10/30/2014    Past Surgical History:  Procedure Laterality Date  . CESAREAN SECTION     x 2  . HERNIA REPAIR     umbilicus  . MICRODISCECTOMY LUMBAR  08/19/2016   L4-L5  . MOLE REMOVAL     multiple  . VAGINAL HYSTERECTOMY  10/2011    Family History  Problem Relation Age of Onset  . Osteoporosis Mother   . Endometriosis Mother   . Hypertension Mother   . Diabetes Father   . Heart disease Father   . Hypertension Father   . Thyroid disease Sister   . Hypertension  Brother   . Hyperlipidemia Brother   . Diabetes Maternal Grandfather   . Colon cancer Paternal Grandmother   . Diabetes Paternal Grandfather   . Stroke Maternal Grandmother   . Breast cancer Neg Hx   . Ovarian cancer Neg Hx     Social History   Socioeconomic History  . Marital status: Married    Spouse name: Not on file  . Number of children: Not on file  . Years of education: Not on file  . Highest education level: Not on file  Occupational History  . Not on file  Social Needs  . Financial resource strain: Not on file  . Food insecurity:    Worry: Not on file    Inability: Not on file  . Transportation needs:    Medical: Not on file    Non-medical: Not on file  Tobacco Use  . Smoking status: Never Smoker  . Smokeless tobacco: Never Used  Substance and Sexual Activity  . Alcohol use: No    Alcohol/week: 0.0 standard drinks  . Drug use: No  . Sexual activity: Yes    Partners: Female    Birth control/protection: Surgical  Lifestyle  . Physical activity:    Days per week: 0 days    Minutes per session: 0 min  . Stress: Not on file  Relationships  . Social connections:    Talks on phone: Not on file    Gets together: Not on file    Attends religious service: Not on file    Active member of club or organization: Not on file    Attends meetings of clubs or organizations: Not on file    Relationship status: Not on file  . Intimate partner violence:    Fear of current or ex partner: Not on file    Emotionally abused: Not on file    Physically abused: Not on file    Forced sexual activity: Not on file  Other Topics Concern  . Not on file  Social History Narrative  . Not on file     Current Outpatient Medications:  .  cetirizine (ZYRTEC) 10 MG tablet, Take by mouth., Disp: , Rfl:  .  Cholecalciferol (VITAMIN D) 2000 units CAPS, Take 1 capsule (2,000 Units total) by mouth daily., Disp: 30 capsule, Rfl: 0 .  dicyclomine (BENTYL) 10 MG capsule, Take 10 mg by mouth  as needed. , Disp: , Rfl:  .  DULoxetine (CYMBALTA) 60 MG capsule, Take 1 capsule (60 mg total) by mouth daily., Disp: 90 capsule, Rfl: 0 .  hydrOXYzine (ATARAX/VISTARIL) 10 MG tablet, Take 1-2 tablets (10-20 mg total) by mouth 3 (three) times daily as needed., Disp: 30 tablet, Rfl: 0 .  losartan (COZAAR) 100 MG tablet, Take 1 tablet (100 mg total) by mouth daily., Disp: 90 tablet, Rfl: 0 .  rosuvastatin (CRESTOR) 20  MG tablet, Take 1 tablet (20 mg total) by mouth daily., Disp: 90 tablet, Rfl: 1  Allergies  Allergen Reactions  . Ciprofloxacin Hcl Other (See Comments)  . Shellfish Allergy   . Sulfamethoxazole-Trimethoprim Other (See Comments)    ROS Constitutional: Negative for fever or weight change.  Respiratory: Negative for cough and shortness of breath.   Cardiovascular: Negative for chest pain or palpitations.  Gastrointestinal: Negative for abdominal pain, no bowel changes.  Musculoskeletal: Negative for gait problem or joint swelling.  Skin: Negative for rash.  Neurological: Negative for dizziness or headache.  No other specific complaints in a complete review of systems (except as listed in HPI above).  Objective  Vitals:   10/19/17 0820  BP: 118/84  Pulse: 70  Resp: 16  Temp: 98 F (36.7 C)  TempSrc: Oral  SpO2: 99%  Weight: 159 lb 11.2 oz (72.4 kg)  Height: 5\' 4"  (1.626 m)    Body mass index is 27.41 kg/m.  Physical Exam  Constitutional: She is oriented to person, place, and time. She appears well-developed and well-nourished. No distress.  HENT:  Head: Normocephalic and atraumatic.  Right Ear: External ear normal.  Left Ear: External ear normal.  Nose: Nose normal.  Mouth/Throat: Oropharynx is clear and moist. No oropharyngeal exudate.  Eyes: Pupils are equal, round, and reactive to light. Conjunctivae and EOM are normal.  Neck: Normal range of motion. Neck supple. No JVD present.  Cardiovascular: Normal rate and regular rhythm.  Murmur (very soft grade  1/6 systolic) heard. Pulmonary/Chest: Effort normal and breath sounds normal.  Musculoskeletal: Normal range of motion. She exhibits no edema or tenderness.       Back:       Arms:      Legs: Red Spots demarcate areas of tender to palpation.  No crepitus, no deformity.  Neurological: She is alert and oriented to person, place, and time. No cranial nerve deficit.  Skin: Skin is warm and dry. No rash noted.  Psychiatric: She has a normal mood and affect. Her behavior is normal. Judgment and thought content normal.  Nursing note and vitals reviewed.  No results found for this or any previous visit (from the past 72 hour(s)).  GAD 7 : Generalized Anxiety Score 10/19/2017 10/19/2017 07/19/2017  Nervous, Anxious, on Edge 0 0 1  Control/stop worrying 0 0 0  Worry too much - different things 0 0 0  Trouble relaxing 0 0 0  Restless 0 0 0  Easily annoyed or irritable 0 0 1  Afraid - awful might happen 0 0 -  Total GAD 7 Score 0 0 -  Anxiety Difficulty Not difficult at all Not difficult at all Not difficult at all    PHQ2/9: Depression screen [facility] 10/19/2017 07/19/2017 04/15/2017 03/24/2017 12/31/2016  Decreased Interest 0 0 0 0 0  Down, Depressed, Hopeless 0 0 0 0 0  PHQ - 2 Score 0 0 0 0 0  Altered sleeping 0 0 - - -  Tired, decreased energy 1 2 - - -  Change in appetite 0 0 - - -  Feeling bad or failure about yourself  0 0 - - -  Trouble concentrating 0 0 - - -  Moving slowly or fidgety/restless 0 0 - - -  Suicidal thoughts 0 0 - - -  PHQ-9 Score 1 2 - - -  Difficult doing work/chores Not difficult at all Not difficult at all - - -   Fall Risk: Fall Risk  10/19/2017  07/19/2017 04/15/2017 03/24/2017 12/31/2016  Falls in the past year? No Yes No No No  Number falls in past yr: - 1 - - -  Injury with Fall? - Yes - - -  Follow up - Falls evaluation completed - - -   Functional Status Survey: Is the patient deaf or have difficulty hearing?: No Does the patient have difficulty seeing, even  when wearing glasses/contacts?: No Does the patient have difficulty concentrating, remembering, or making decisions?: No Does the patient have difficulty walking or climbing stairs?: No Does the patient have difficulty dressing or bathing?: No Does the patient have difficulty doing errands alone such as visiting a doctor's office or shopping?: No  Assessment & Plan  1. Essential hypertension, benign - Stable, continue medication as prescribed - losartan (COZAAR) 100 MG tablet; Take 1 tablet (100 mg total) by mouth daily.  Dispense: 90 tablet; Refill: 1 - BASIC METABOLIC PANEL WITH GFR  2. Pure hypercholesterolemia Stable, continue crestor  3. GAD (generalized anxiety disorder) - DULoxetine (CYMBALTA) 60 MG capsule; Take 1 capsule (60 mg total) by mouth daily.  Dispense: 90 capsule; Refill: 1  4. Irritable bowel syndrome with diarrhea - Stable; bentyl PRN  5. Cardiac murmur - Stable; no additional cardiac follow up indicated  6. Aortic valve sclerosis - Stable; no additional cardiac follow up indicated  7. Positive ANA (antinuclear antibody) - Keep follow up with Dr. Renard Matter; points of tenderness consistent with FMS today.  8. Prediabetes - BASIC METABOLIC PANEL WITH GFR - Hemoglobin A1c  9. Easy bruising - CBC  10. Encounter for screening for HIV - No notable risk factors for HIV, routine screening only - HIV antibody

## 2017-10-19 NOTE — Patient Instructions (Signed)
DASH Eating Plan DASH stands for "Dietary Approaches to Stop Hypertension." The DASH eating plan is a healthy eating plan that has been shown to reduce high blood pressure (hypertension). It may also reduce your risk for type 2 diabetes, heart disease, and stroke. The DASH eating plan may also help with weight loss. What are tips for following this plan? General guidelines  Avoid eating more than 2,300 mg (milligrams) of salt (sodium) a day. If you have hypertension, you may need to reduce your sodium intake to 1,500 mg a day.  Limit alcohol intake to no more than 1 drink a day for nonpregnant women and 2 drinks a day for men. One drink equals 12 oz of beer, 5 oz of wine, or 1 oz of hard liquor.  Work with your health care provider to maintain a healthy body weight or to lose weight. Ask what an ideal weight is for you.  Get at least 30 minutes of exercise that causes your heart to beat faster (aerobic exercise) most days of the week. Activities may include walking, swimming, or biking.  Work with your health care provider or diet and nutrition specialist (dietitian) to adjust your eating plan to your individual calorie needs. Reading food labels  Check food labels for the amount of sodium per serving. Choose foods with less than 5 percent of the Daily Value of sodium. Generally, foods with less than 300 mg of sodium per serving fit into this eating plan.  To find whole grains, look for the word "whole" as the first word in the ingredient list. Shopping  Buy products labeled as "low-sodium" or "no salt added."  Buy fresh foods. Avoid canned foods and premade or frozen meals. Cooking  Avoid adding salt when cooking. Use salt-free seasonings or herbs instead of table salt or sea salt. Check with your health care provider or pharmacist before using salt substitutes.  Do not fry foods. Cook foods using healthy methods such as baking, boiling, grilling, and broiling instead.  Cook with  heart-healthy oils, such as olive, canola, soybean, or sunflower oil. Meal planning   Eat a balanced diet that includes: ? 5 or more servings of fruits and vegetables each day. At each meal, try to fill half of your plate with fruits and vegetables. ? Up to 6-8 servings of whole grains each day. ? Less than 6 oz of lean meat, poultry, or fish each day. A 3-oz serving of meat is about the same size as a deck of cards. One egg equals 1 oz. ? 2 servings of low-fat dairy each day. ? A serving of nuts, seeds, or beans 5 times each week. ? Heart-healthy fats. Healthy fats called Omega-3 fatty acids are found in foods such as flaxseeds and coldwater fish, like sardines, salmon, and mackerel.  Limit how much you eat of the following: ? Canned or prepackaged foods. ? Food that is high in trans fat, such as fried foods. ? Food that is high in saturated fat, such as fatty meat. ? Sweets, desserts, sugary drinks, and other foods with added sugar. ? Full-fat dairy products.  Do not salt foods before eating.  Try to eat at least 2 vegetarian meals each week.  Eat more home-cooked food and less restaurant, buffet, and fast food.  When eating at a restaurant, ask that your food be prepared with less salt or no salt, if possible. What foods are recommended? The items listed may not be a complete list. Talk with your dietitian about what   dietary choices are best for you. Grains Whole-grain or whole-wheat bread. Whole-grain or whole-wheat pasta. Brown rice. Oatmeal. Quinoa. Bulgur. Whole-grain and low-sodium cereals. Pita bread. Low-fat, low-sodium crackers. Whole-wheat flour tortillas. Vegetables Fresh or frozen vegetables (raw, steamed, roasted, or grilled). Low-sodium or reduced-sodium tomato and vegetable juice. Low-sodium or reduced-sodium tomato sauce and tomato paste. Low-sodium or reduced-sodium canned vegetables. Fruits All fresh, dried, or frozen fruit. Canned fruit in natural juice (without  added sugar). Meat and other protein foods Skinless chicken or turkey. Ground chicken or turkey. Pork with fat trimmed off. Fish and seafood. Egg whites. Dried beans, peas, or lentils. Unsalted nuts, nut butters, and seeds. Unsalted canned beans. Lean cuts of beef with fat trimmed off. Low-sodium, lean deli meat. Dairy Low-fat (1%) or fat-free (skim) milk. Fat-free, low-fat, or reduced-fat cheeses. Nonfat, low-sodium ricotta or cottage cheese. Low-fat or nonfat yogurt. Low-fat, low-sodium cheese. Fats and oils Soft margarine without trans fats. Vegetable oil. Low-fat, reduced-fat, or light mayonnaise and salad dressings (reduced-sodium). Canola, safflower, olive, soybean, and sunflower oils. Avocado. Seasoning and other foods Herbs. Spices. Seasoning mixes without salt. Unsalted popcorn and pretzels. Fat-free sweets. What foods are not recommended? The items listed may not be a complete list. Talk with your dietitian about what dietary choices are best for you. Grains Baked goods made with fat, such as croissants, muffins, or some breads. Dry pasta or rice meal packs. Vegetables Creamed or fried vegetables. Vegetables in a cheese sauce. Regular canned vegetables (not low-sodium or reduced-sodium). Regular canned tomato sauce and paste (not low-sodium or reduced-sodium). Regular tomato and vegetable juice (not low-sodium or reduced-sodium). Pickles. Olives. Fruits Canned fruit in a light or heavy syrup. Fried fruit. Fruit in cream or butter sauce. Meat and other protein foods Fatty cuts of meat. Ribs. Fried meat. Bacon. Sausage. Bologna and other processed lunch meats. Salami. Fatback. Hotdogs. Bratwurst. Salted nuts and seeds. Canned beans with added salt. Canned or smoked fish. Whole eggs or egg yolks. Chicken or turkey with skin. Dairy Whole or 2% milk, cream, and half-and-half. Whole or full-fat cream cheese. Whole-fat or sweetened yogurt. Full-fat cheese. Nondairy creamers. Whipped toppings.  Processed cheese and cheese spreads. Fats and oils Butter. Stick margarine. Lard. Shortening. Ghee. Bacon fat. Tropical oils, such as coconut, palm kernel, or palm oil. Seasoning and other foods Salted popcorn and pretzels. Onion salt, garlic salt, seasoned salt, table salt, and sea salt. Worcestershire sauce. Tartar sauce. Barbecue sauce. Teriyaki sauce. Soy sauce, including reduced-sodium. Steak sauce. Canned and packaged gravies. Fish sauce. Oyster sauce. Cocktail sauce. Horseradish that you find on the shelf. Ketchup. Mustard. Meat flavorings and tenderizers. Bouillon cubes. Hot sauce and Tabasco sauce. Premade or packaged marinades. Premade or packaged taco seasonings. Relishes. Regular salad dressings. Where to find more information:  National Heart, Lung, and Blood Institute: www.nhlbi.nih.gov  American Heart Association: www.heart.org Summary  The DASH eating plan is a healthy eating plan that has been shown to reduce high blood pressure (hypertension). It may also reduce your risk for type 2 diabetes, heart disease, and stroke.  With the DASH eating plan, you should limit salt (sodium) intake to 2,300 mg a day. If you have hypertension, you may need to reduce your sodium intake to 1,500 mg a day.  When on the DASH eating plan, aim to eat more fresh fruits and vegetables, whole grains, lean proteins, low-fat dairy, and heart-healthy fats.  Work with your health care provider or diet and nutrition specialist (dietitian) to adjust your eating plan to your individual   calorie needs. This information is not intended to replace advice given to you by your health care provider. Make sure you discuss any questions you have with your health care provider. Document Released: 02/11/2011 Document Revised: 02/16/2016 Document Reviewed: 02/16/2016 Elsevier Interactive Patient Education  2018 [employer]  

## 2017-10-20 LAB — BASIC METABOLIC PANEL WITH GFR
BUN: 17 mg/dL (ref 7–25)
CALCIUM: 9.1 mg/dL (ref 8.6–10.2)
CHLORIDE: 105 mmol/L (ref 98–110)
CO2: 26 mmol/L (ref 20–32)
Creat: 0.68 mg/dL (ref 0.50–1.10)
GFR, EST AFRICAN AMERICAN: 128 mL/min/{1.73_m2} (ref >=60)
GFR, Est Non African American: 110 mL/min/{1.73_m2} (ref >=60)
Glucose, Bld: 93 mg/dL (ref 65–99)
POTASSIUM: 4.8 mmol/L (ref 3.5–5.3)
Sodium: 138 mmol/L (ref 135–146)

## 2017-10-20 LAB — HEMOGLOBIN A1C
Hgb A1c MFr Bld: 5.7 % of total Hgb — ABNORMAL HIGH (ref <5.7)
MEAN PLASMA GLUCOSE: 117 (calc)
eAG (mmol/L): 6.5 (calc)

## 2017-10-20 LAB — HIV ANTIBODY (ROUTINE TESTING W REFLEX): HIV 1&2 Ab, 4th Generation: NONREACTIVE

## 2017-10-20 LAB — CBC
HEMATOCRIT: 37.8 % (ref 35.0–45.0)
HEMOGLOBIN: 12.3 g/dL (ref 11.7–15.5)
MCH: 29.9 pg (ref 27.0–33.0)
MCHC: 32.5 g/dL (ref 32.0–36.0)
MCV: 92 fL (ref 80.0–100.0)
MPV: 12.3 fL (ref 7.5–12.5)
PLATELETS: 242 10*3/uL (ref 140–400)
RBC: 4.11 10*6/uL (ref 3.80–5.10)
RDW: 12.2 % (ref 11.0–15.0)
WBC: 6.8 10*3/uL (ref 3.8–10.8)

## 2017-11-16 DIAGNOSIS — M791 Myalgia, unspecified site: Secondary | ICD-10-CM | POA: Diagnosis not present

## 2017-11-16 DIAGNOSIS — Z9889 Other specified postprocedural states: Secondary | ICD-10-CM | POA: Diagnosis not present

## 2018-01-20 ENCOUNTER — Ambulatory Visit: Payer: Federal, State, Local not specified - PPO | Admitting: Family Medicine

## 2018-05-03 ENCOUNTER — Other Ambulatory Visit: Payer: Self-pay | Admitting: Emergency Medicine

## 2018-05-03 DIAGNOSIS — I10 Essential (primary) hypertension: Secondary | ICD-10-CM

## 2018-05-03 DIAGNOSIS — F411 Generalized anxiety disorder: Secondary | ICD-10-CM

## 2018-05-03 MED ORDER — LOSARTAN POTASSIUM 100 MG PO TABS: 100.0000 mg | ORAL_TABLET | Freq: Every day | ORAL | 0 refills | Status: DC

## 2018-05-03 MED ORDER — DULOXETINE HCL 60 MG PO CPEP: 60.0000 mg | ORAL_CAPSULE | Freq: Every day | ORAL | 0 refills | Status: DC

## 2018-05-24 ENCOUNTER — Encounter: Payer: Self-pay | Admitting: Obstetrics and Gynecology

## 2018-05-24 ENCOUNTER — Ambulatory Visit (INDEPENDENT_AMBULATORY_CARE_PROVIDER_SITE_OTHER): Payer: Federal, State, Local not specified - PPO | Admitting: Obstetrics and Gynecology

## 2018-05-24 ENCOUNTER — Other Ambulatory Visit: Payer: Self-pay

## 2018-05-24 VITALS — BP 126/76 | HR 80 | Ht 64.0 in | Wt 152.5 lb

## 2018-05-24 DIAGNOSIS — E663 Overweight: Secondary | ICD-10-CM

## 2018-05-24 DIAGNOSIS — Z01419 Encounter for gynecological examination (general) (routine) without abnormal findings: Secondary | ICD-10-CM

## 2018-05-24 DIAGNOSIS — Z800 Family history of malignant neoplasm of digestive organs: Secondary | ICD-10-CM

## 2018-05-24 DIAGNOSIS — Z1239 Encounter for other screening for malignant neoplasm of breast: Secondary | ICD-10-CM | POA: Diagnosis not present

## 2018-05-24 DIAGNOSIS — Z8742 Personal history of other diseases of the female genital tract: Secondary | ICD-10-CM

## 2018-05-24 NOTE — Progress Notes (Signed)
PT is present today for her annual exam. Pt stated that she has been doing self-breast exams monthly. Pt stated that she is doing well and denies any issues. No problems or concerns.     

## 2018-05-24 NOTE — Progress Notes (Signed)
GYNECOLOGY ANNUAL PHYSICAL EXAM PROGRESS NOTE  Subjective:    Savannah Richards is a 43 y.o. [phone] female who presents for an annual exam. She has a PMH of endometriosis (is s/p hysterectomy, TVH). The patient has no complaints today. The patient is sexually active. The patient wears seatbelts: yes. The patient participates in regular exercise: no. Has the patient ever been transfused or tattooed?: no. The patient reports that there is not domestic violence in her life.    Gynecologic History  Menarche age:  No LMP recorded. Patient has had a hysterectomy. Contraception: status post hysterectomy History of STI's: Denies Last Pap: 01/2014. Results were: normal.  Denies h/o abnormal pap smears. Last mammogram: has never had one.    OB History  Gravida Para Term Preterm AB Living  0 1 3  SAB TAB Ectopic Multiple Live Births  1 0 0 0 3    # Outcome Date GA Lbr Len/2nd Weight Sex Delivery Anes PTL Lv  4 Term 2011   8 lb 1.8 oz (3.679 kg) M CS-LTranv   LIV  3 Term 2010   8 lb 1.8 oz (3.679 kg)  CS-LTranv   LIV  2 SAB           1 Term    8 lb 1.9 oz (3.683 kg) M Vag-Spont   LIV    Past Medical History:  Diagnosis Date  . Anxiety   . Dyspareunia   . Elevated hemoglobin (HCC)   . Elevated lipids   . Endometriosis   . GERD (gastroesophageal reflux disease)   . Hematuria   . Herniated cervical disc   . Hypertension   . IBS (irritable bowel syndrome)   . Infertility, female   . Irregular periods   . Ovarian cyst   . Pelvic adhesions   . Urinary urgency   . Vaginal discharge   . Vaginal Pap smear, abnormal     Past Surgical History:  Procedure Laterality Date  . CESAREAN SECTION     x 2  . HERNIA REPAIR     umbilicus  . MICRODISCECTOMY LUMBAR  08/19/2016   L4-L5  . MOLE REMOVAL     multiple  . VAGINAL HYSTERECTOMY  10/2011    Family History  Problem Relation Age of Onset  . Osteoporosis Mother   . Endometriosis Mother   . Hypertension Mother   .  Diabetes Father   . Heart disease Father   . Hypertension Father   . Thyroid disease Sister   . Hypertension Brother   . Hyperlipidemia Brother   . Diabetes Maternal Grandfather   . Colon cancer Paternal Grandmother   . Diabetes Paternal Grandfather   . Stroke Maternal Grandmother   . Breast cancer Neg Hx   . Ovarian cancer Neg Hx     Social History   Socioeconomic History  . Marital status: Married    Spouse name: Not on file  . Number of children: Not on file  . Years of education: Not on file  . Highest education level: Not on file  Occupational History  . Not on file  Social Needs  . Financial resource strain: Not on file  . Food insecurity:    Worry: Not on file    Inability: Not on file  . Transportation needs:    Medical: Not on file    Non-medical: Not on file  Tobacco Use  . Smoking status: Never Smoker  . Smokeless tobacco: Never Used  Substance and Sexual Activity  . Alcohol use: No    Alcohol/week: 0.0 standard drinks  . Drug use: No  . Sexual activity: Yes    Partners: Female    Birth control/protection: Surgical  Lifestyle  . Physical activity:    Days per week: 0 days    Minutes per session: 0 min  . Stress: Not on file  Relationships  . Social connections:    Talks on phone: Not on file    Gets together: Not on file    Attends religious service: Not on file    Active member of club or organization: Not on file    Attends meetings of clubs or organizations: Not on file    Relationship status: Not on file  . Intimate partner violence:    Fear of current or ex partner: Not on file    Emotionally abused: Not on file    Physically abused: Not on file    Forced sexual activity: Not on file  Other Topics Concern  . Not on file  Social History Narrative  . Not on file    Current Outpatient Medications on File Prior to Visit  Medication Sig Dispense Refill  . cetirizine (ZYRTEC) 10 MG tablet Take by mouth as needed.     . Cholecalciferol  (VITAMIN D) 2000 units CAPS Take 1 capsule (2,000 Units total) by mouth daily. 30 capsule 0  . dicyclomine (BENTYL) 10 MG capsule Take 10 mg by mouth as needed.     . DULoxetine (CYMBALTA) 60 MG capsule Take 1 capsule (60 mg total) by mouth daily. 90 capsule 0  . hydrOXYzine (ATARAX/VISTARIL) 10 MG tablet Take 1-2 tablets (10-20 mg total) by mouth 3 (three) times daily as needed. 30 tablet 0  . losartan (COZAAR) 100 MG tablet Take 1 tablet (100 mg total) by mouth daily. 90 tablet 0  . rosuvastatin (CRESTOR) 20 MG tablet Take 1 tablet (20 mg total) by mouth daily. 90 tablet 1   No current facility-administered medications on file prior to visit.     Allergies  Allergen Reactions  . Ciprofloxacin Hcl Other (See Comments)  . Shellfish Allergy   . Sulfamethoxazole-Trimethoprim Other (See Comments)    Review of Systems Constitutional: negative for chills, fatigue, fevers and sweats Eyes: negative for irritation, redness and visual disturbance Ears, nose, mouth, throat, and face: negative for hearing loss, nasal congestion, snoring and tinnitus Respiratory: negative for asthma, cough, sputum Cardiovascular: negative for chest pain, dyspnea, exertional chest pressure/discomfort, irregular heart beat, palpitations and syncope Gastrointestinal: negative for abdominal pain, change in bowel habits, nausea and vomiting Genitourinary: negative for abnormal menstrual periods, genital lesions, sexual problems and vaginal discharge, dysuria and urinary incontinence Integument/breast: negative for breast lump, breast tenderness and nipple discharge Hematologic/lymphatic: negative for bleeding and easy bruising Musculoskeletal:negative for back pain and muscle weakness Neurological: negative for dizziness, headaches, vertigo and weakness Endocrine: negative for diabetic symptoms including polydipsia, polyuria and skin dryness Allergic/Immunologic: negative for hay fever and urticaria         Objective:  Blood pressure 126/76, pulse 80, height 5\' 4"  (1.626 m), weight 152 lb 8 oz (69.2 kg). Body mass index is 26.18 kg/m.   General Appearance:    Alert, cooperative, no distress, appears stated age, overweight  Head:    Normocephalic, without obvious abnormality, atraumatic  Eyes:    PERRL, conjunctiva/corneas clear, EOM's intact, both eyes  Ears:    Normal external ear canals, both ears  Nose:   Nares normal,  septum midline, mucosa normal, no drainage or sinus tenderness  Throat:   Lips, mucosa, and tongue normal; teeth and gums normal  Neck:   Supple, symmetrical, trachea midline, no adenopathy; thyroid: no enlargement/tenderness/nodules; no carotid bruit or JVD  Back:     Symmetric, no curvature, ROM normal, no CVA tenderness  Lungs:     Clear to auscultation bilaterally, respirations unlabored  Chest Wall:    No tenderness or deformity   Heart:    Regular rate and rhythm, S1 and S2 normal, no murmur, rub or gallop  Breast Exam:    No tenderness, masses, or nipple abnormality  Abdomen:     Soft, non-tender, bowel sounds active all four quadrants, no masses, no organomegaly.    Genitalia:    External genitalia normal, vagina without lesions, scant thin discharge, no tenderness, rectovaginal septum  normal. Cervix normal in appearance. Uterus and cervix surgically absent, vaginal cuff well-healed., No adnexal masses or tenderness.  Uterus normal size, shape, mobile, regular contours, nontender.  Rectal:    Normal external sphincter.  No hemorrhoids appreciated. Internal exam not done.   Extremities:   Extremities normal, atraumatic, no cyanosis or edema  Pulses:   2+ and symmetric all extremities  Skin:   Skin color, texture, turgor normal, no rashes or lesions  Lymph nodes:   Cervical, supraclavicular, and axillary nodes normal  Neurologic:   CNII-XII intact, normal strength, sensation and reflexes throughout   .  Labs:  Lab Results  Component Value Date   WBC 6.8 10/19/2017    HGB 12.3 10/19/2017   HCT 37.8 10/19/2017   MCV 92.0 10/19/2017   PLT 242 10/19/2017    Lab Results  Component Value Date   CREATININE 0.68 10/19/2017   BUN 17 10/19/2017   NA 138 10/19/2017   K 4.8 10/19/2017   CL 105 10/19/2017   CO2 26 10/19/2017    Lab Results  Component Value Date   ALT 13 07/19/2017   AST 17 07/19/2017   ALKPHOS 66 01/22/2015   BILITOT 0.5 07/19/2017    Lab Results  Component Value Date   TSH 1.15 04/15/2017     Assessment:   Encounter for well woman exam with routine gynecological exam  Breast screening   History of endometriosis  Overweight (BMI 25.0-29.9)  Family history of colon cancer  Plan:     Blood tests: None ordered. Patient has an annual with PCP in 2 weeks, will order then.. Breast self exam technique reviewed and patient encouraged to perform self-exam monthly. Contraception: status post hysterectomy. Discussed healthy lifestyle modifications. Mammogram ordered. Pap smear no longer required.   Patient has had a hysterectomy.  Up to date on flu vaccine. History of endometriosis, asymptomatic s/p hysterectomy.  Family history of colon cancer in PGM, and colon polyps in father. Advised patient to discuss with PCP whether or not she would be a candidate for earlier colon screenings.  RTC in 1 year for annual exam.     Hildred Laser, MD [facility]

## 2018-05-24 NOTE — Patient Instructions (Signed)
Preventive Care 40-64 Years, Female Preventive care refers to lifestyle choices and visits with your health care provider that can promote health and wellness. What does preventive care include?   A yearly physical exam. This is also called an annual well check.  Dental exams once or twice a year.  Routine eye exams. Ask your health care provider how often you should have your eyes checked.  Personal lifestyle choices, including: ? Daily care of your teeth and gums. ? Regular physical activity. ? Eating a healthy diet. ? Avoiding tobacco and drug use. ? Limiting alcohol use. ? Practicing safe sex. ? Taking low-dose aspirin daily starting at age 52. ? Taking vitamin and mineral supplements as recommended by your health care provider. What happens during an annual well check? The services and screenings done by your health care provider during your annual well check will depend on your age, overall health, lifestyle risk factors, and family history of disease. Counseling Your health care provider may ask you questions about your:  Alcohol use.  Tobacco use.  Drug use.  Emotional well-being.  Home and relationship well-being.  Sexual activity.  Eating habits.  Work and work Statistician.  Method of birth control.  Menstrual cycle.  Pregnancy history. Screening You may have the following tests or measurements:  Height, weight, and BMI.  Blood pressure.  Lipid and cholesterol levels. These may be checked every 5 years, or more frequently if you are over 69 years old.  Skin check.  Lung cancer screening. You may have this screening every year starting at age 55 if you have a 30-pack-year history of smoking and currently smoke or have quit within the past 15 years.  Colorectal cancer screening. All adults should have this screening starting at age 97 and continuing until age 90. Your health care provider may recommend screening at age 61. You will have tests every  1-10 years, depending on your results and the type of screening test. People at increased risk should start screening at an earlier age. Screening tests may include: ? Guaiac-based fecal occult blood testing. ? Fecal immunochemical test (FIT). ? Stool DNA test. ? Virtual colonoscopy. ? Sigmoidoscopy. During this test, a flexible tube with a tiny camera (sigmoidoscope) is used to examine your rectum and lower colon. The sigmoidoscope is inserted through your anus into your rectum and lower colon. ? Colonoscopy. During this test, a long, thin, flexible tube with a tiny camera (colonoscope) is used to examine your entire colon and rectum.  Hepatitis C blood test.  Hepatitis B blood test.  Sexually transmitted disease (STD) testing.  Diabetes screening. This is done by checking your blood sugar (glucose) after you have not eaten for a while (fasting). You may have this done every 1-3 years.  Mammogram. This may be done every 1-2 years. Talk to your health care provider about when you should start having regular mammograms. This may depend on whether you have a family history of breast cancer.  BRCA-related cancer screening. This may be done if you have a family history of breast, ovarian, tubal, or peritoneal cancers.  Pelvic exam and Pap test. This may be done every 3 years starting at age 70. Starting at age 51, this may be done every 5 years if you have a Pap test in combination with an HPV test.  Bone density scan. This is done to screen for osteoporosis. You may have this scan if you are at high risk for osteoporosis. Discuss your test results, treatment options,  and if necessary, the need for more tests with your health care provider. Vaccines Your health care provider may recommend certain vaccines, such as:  Influenza vaccine. This is recommended every year.  Tetanus, diphtheria, and acellular pertussis (Tdap, Td) vaccine. You may need a Td booster every 10 years.  Varicella  vaccine. You may need this if you have not been vaccinated.  Zoster vaccine. You may need this after age 67.  Measles, mumps, and rubella (MMR) vaccine. You may need at least one dose of MMR if you were born in 1957 or later. You may also need a second dose.  Pneumococcal 13-valent conjugate (PCV13) vaccine. You may need this if you have certain conditions and were not previously vaccinated.  Pneumococcal polysaccharide (PPSV23) vaccine. You may need one or two doses if you smoke cigarettes or if you have certain conditions.  Meningococcal vaccine. You may need this if you have certain conditions.  Hepatitis A vaccine. You may need this if you have certain conditions or if you travel or work in places where you may be exposed to hepatitis A.  Hepatitis B vaccine. You may need this if you have certain conditions or if you travel or work in places where you may be exposed to hepatitis B.  Haemophilus influenzae type b (Hib) vaccine. You may need this if you have certain conditions. Talk to your health care provider about which screenings and vaccines you need and how often you need them. This information is not intended to replace advice given to you by your health care provider. Make sure you discuss any questions you have with your health care provider. Document Released: 03/21/2015 Document Revised: 04/14/2017 Document Reviewed: 12/24/2014 Elsevier Interactive Patient Education  2019 [place] Breast self-awareness means:  Knowing how your breasts look.  Knowing how your breasts feel.  Checking your breasts every month for changes.  Telling your doctor if you notice a change in your breasts. Breast self-awareness allows you to notice a breast problem early while it is still small. How to do a breast self-exam One way to learn what is normal for your breasts and to check for changes is to do a breast self-exam. To do a breast self-exam: Look for Changes   1. Take off all the clothes above your waist. 2. Stand in front of a mirror in a room with good lighting. 3. Put your hands on your hips. 4. Push your hands down. 5. Look at your breasts and nipples in the mirror to see if one breast or nipple looks different than the other. Check to see if: ? The shape of one breast is different. ? The size of one breast is different. ? There are wrinkles, dips, and bumps in one breast and not the other. 6. Look at each breast for changes in your skin, such as: ? Redness. ? Scaly areas. 7. Look for changes in your nipples, such as: ? Liquid around the nipples. ? Bleeding. ? Dimpling. ? Redness. ? A change in where the nipples are. Feel for Changes 1. Lie on your back on the floor. 2. Feel each breast. To do this, follow these steps: ? Pick a breast to feel. ? Put the arm closest to that breast above your head. ? Use your other arm to feel the nipple area of your breast. Feel the area with the pads of your three middle fingers by making small circles with your fingers. For the first circle, press lightly. For the second  circle, press harder. For the third circle, press even harder. ? Keep making circles with your fingers at the light, harder, and even harder pressures as you move down your breast. Stop when you feel your ribs. ? Move your fingers a little toward the center of your body. ? Start making circles with your fingers again, this time going up until you reach your collarbone. ? Keep making up and down circles until you reach your armpit. Remember to keep using the three pressures. ? Feel the other breast in the same way. 3. Sit or stand in the shower or tub. 4. With soapy water on your skin, feel each breast the same way you did in step 2, when you were lying on the floor. Write Down What You Find After doing the self-exam, write down:  What is normal for each breast.  Any changes you find in each breast.  When you last had your period.   How often should I check my breasts? Check your breasts every month. If you are breastfeeding, the best time to check them is after you feed your baby or after you use a breast pump. If you get periods, the best time to check your breasts is 5-7 days after your period is over. When should I see my doctor? See your doctor if you notice:  A change in shape or size of your breasts or nipples.  A change in the skin of your breast or nipples, such as red or scaly skin.  Unusual fluid coming from your nipples.  A lump or thick area that was not there before.  Pain in your breasts.  Anything that concerns you. This information is not intended to replace advice given to you by your health care provider. Make sure you discuss any questions you have with your health care provider. Document Released: 08/11/2007 Document Revised: 07/31/2015 Document Reviewed: 01/12/2015 Elsevier Interactive Patient Education  2019 [employer].

## 2018-06-05 ENCOUNTER — Encounter: Payer: Self-pay | Admitting: Family Medicine

## 2018-06-06 ENCOUNTER — Encounter: Payer: Self-pay | Admitting: Family Medicine

## 2018-06-06 ENCOUNTER — Ambulatory Visit (INDEPENDENT_AMBULATORY_CARE_PROVIDER_SITE_OTHER): Payer: Federal, State, Local not specified - PPO | Admitting: Family Medicine

## 2018-06-06 DIAGNOSIS — R768 Other specified abnormal immunological findings in serum: Secondary | ICD-10-CM

## 2018-06-06 DIAGNOSIS — I10 Essential (primary) hypertension: Secondary | ICD-10-CM | POA: Diagnosis not present

## 2018-06-06 DIAGNOSIS — E7800 Pure hypercholesterolemia, unspecified: Secondary | ICD-10-CM

## 2018-06-06 DIAGNOSIS — F411 Generalized anxiety disorder: Secondary | ICD-10-CM

## 2018-06-06 DIAGNOSIS — R7303 Prediabetes: Secondary | ICD-10-CM

## 2018-06-06 DIAGNOSIS — E559 Vitamin D deficiency, unspecified: Secondary | ICD-10-CM

## 2018-06-06 NOTE — Progress Notes (Signed)
Name: Savannah Richards   MRN: [id number]    DOB: [birth year]   Date:06/06/2018       Progress Note  Subjective  Chief Complaint  Chief Complaint  Patient presents with  . Hypertension  . Hyperlipidemia  . Prediabetes    I connected with@ on 06/06/18 at  2:00 PM EDT by a video enabled telemedicine application and verified that I am speaking with the correct person using two identifiers.  I discussed the limitations of evaluation and management by telemedicine and the availability of in person appointments. The patient expressed understanding and agreed to proceed. Staff also discussed with the patient that there may be a patient responsible charge related to this service. Patient Location: at work  Provider Location: [facility]   HPI  HTN: had multiple tests done, including for rule out of hyperaldosteronism or pheochromocytoma. BP is low since switched from Losartan to Micardis but is feeling tired and has been back to Losartan since 07/2017 No chest pain or palpitation  Hyperlipidemia: last LDL was 186, not on medication, positive family history of heart disease, maternal grandmother had a stroke in her 85's, maternal uncles MI's in their 59's. Mother has HTN and hyperlipidemia. She is only on life style modifcation and Crestor.   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 She states currently doing well, takes Bentyl very seldom, Bristol scale of 4, no abdominal cramping at this time, and not missing work. She states having children, hysterectomy and cymbalta seems to have helped with symptoms. Unchanged   GAD: doing well on higher dose of Cymbalta and only took hydroxyzine twice , she states once during her son's birthday party , also two weeks ago when her mother-in-law stayed in her house for one week. She  states medication takes the edge off.   Arthralgia and muscle aches: positive ANA< seen by Dr. Renard Matter but given reassurance and advised tylenol and ibuprofen prn, we rechecked labs back in 07/2017 and sed rate was elevated at 38 but normal CRP at 2.0   She states symptoms has been worse lately, she states also having swelling on knees and elbows. States worse when the weather changes, sometimes just has flares . Advised to follow up with Dr. Renard Matter   Prediabetes: last A1C was done 10/2017 and it was 5.7%  denies polyphagia, polyuria or polydipsia.    Patient Active Problem List   Diagnosis Date Noted  . GAD (generalized anxiety disorder) 10/19/2017  . Irritable bowel syndrome with diarrhea 07/19/2017  . Positive ANA (antinuclear antibody) 05/16/2017  . History of total vaginal hysterectomy (TVH) 05/11/2017  . History of back surgery 08/19/2016  . Aortic valve sclerosis 08/03/2016  . Gastroesophageal reflux disease 07/23/2016  . Essential hypertension, benign 07/08/2015  . Vitamin D insufficiency 05/28/2015  . Cardiac murmur 02/18/2015  . Prediabetes 12/11/2014  . Hyperlipidemia 12/11/2014  . Anxiety 10/30/2014    Past Surgical History:  Procedure Laterality Date  . CESAREAN SECTION     x 2  . HERNIA REPAIR     umbilicus  . MICRODISCECTOMY LUMBAR  08/19/2016   L4-L5  . MOLE REMOVAL     multiple  . VAGINAL HYSTERECTOMY  10/2011    Family History  Problem Relation Age of Onset  . Osteoporosis Mother   . Endometriosis Mother   . Hypertension  Mother   . Diabetes Father   . Heart disease Father   . Hypertension Father   . Colon polyps Father   . Thyroid disease Sister   . Hypertension Brother   . Hyperlipidemia Brother   . Diabetes Maternal Grandfather   . Colon cancer Paternal Grandmother   . Diabetes Paternal Grandfather   . Stroke Maternal Grandmother   . Breast cancer Neg Hx   . Ovarian cancer Neg Hx     Social History   Socioeconomic History  . Marital status:  Married    Spouse name: Not on file  . Number of children: Not on file  . Years of education: Not on file  . Highest education level: Not on file  Occupational History  . Not on file  Social Needs  . Financial resource strain: Not on file  . Food insecurity:    Worry: Not on file    Inability: Not on file  . Transportation needs:    Medical: Not on file    Non-medical: Not on file  Tobacco Use  . Smoking status: Never Smoker  . Smokeless tobacco: Never Used  Substance and Sexual Activity  . Alcohol use: No    Alcohol/week: 0.0 standard drinks  . Drug use: No  . Sexual activity: Yes    Partners: Female    Birth control/protection: Surgical  Lifestyle  . Physical activity:    Days per week: 0 days    Minutes per session: 0 min  . Stress: Not on file  Relationships  . Social connections:    Talks on phone: Not on file    Gets together: Not on file    Attends religious service: Not on file    Active member of club or organization: Not on file    Attends meetings of clubs or organizations: Not on file    Relationship status: Not on file  . Intimate partner violence:    Fear of current or ex partner: Not on file    Emotionally abused: Not on file    Physically abused: Not on file    Forced sexual activity: Not on file  Other Topics Concern  . Not on file  Social History Narrative  . Not on file     Current Outpatient Medications:  .  cetirizine (ZYRTEC) 10 MG tablet, Take by mouth as needed. , Disp: , Rfl:  .  Cholecalciferol (VITAMIN D) 2000 units CAPS, Take 1 capsule (2,000 Units total) by mouth daily., Disp: 30 capsule, Rfl: 0 .  dicyclomine (BENTYL) 10 MG capsule, Take 10 mg by mouth as needed. , Disp: , Rfl:  .  DULoxetine (CYMBALTA) 60 MG capsule, Take 1 capsule (60 mg total) by mouth daily., Disp: 90 capsule, Rfl: 0 .  hydrOXYzine (ATARAX/VISTARIL) 10 MG tablet, Take 1-2 tablets (10-20 mg total) by mouth 3 (three) times daily as needed., Disp: 30 tablet, Rfl: 0  .  losartan (COZAAR) 100 MG tablet, Take 1 tablet (100 mg total) by mouth daily., Disp: 90 tablet, Rfl: 0 .  rosuvastatin (CRESTOR) 20 MG tablet, Take 1 tablet (20 mg total) by mouth daily., Disp: 90 tablet, Rfl: 1  Allergies  Allergen Reactions  . Ciprofloxacin Hcl Other (See Comments)  . Shellfish Allergy   . Sulfamethoxazole-Trimethoprim Other (See Comments)    I personally reviewed active problem list, medication list, allergies, family history, social history with the patient/caregiver today.   ROS  Constitutional: Negative for fever or weight change.  Respiratory: Negative for  cough and shortness of breath.   Cardiovascular: Negative for chest pain or palpitations.  Gastrointestinal: Negative for abdominal pain, no bowel changes.  Musculoskeletal: Negative for gait problem or joint swelling.  Skin: Negative for rash.  Neurological: Negative for dizziness or headache.  No other specific complaints in a complete review of systems (except as listed in HPI above).  Objective  Virtual encounter, vitals not obtained.  There is no height or weight on file to calculate BMI.  Physical Exam  Awake, alert, walking without distress   PHQ2/9: Depression screen [facility] 2/9 06/06/2018 10/19/2017 07/19/2017 04/15/2017 03/24/2017  Decreased Interest 0 0 0 0 0  Down, Depressed, Hopeless 0 0 0 0 0  PHQ - 2 Score 0 0 0 0 0  Altered sleeping 0 0 0 - -  Tired, decreased energy 0 1 2 - -  Change in appetite 0 0 0 - -  Feeling bad or failure about yourself  0 0 0 - -  Trouble concentrating 0 0 0 - -  Moving slowly or fidgety/restless 0 0 0 - -  Suicidal thoughts 0 0 0 - -  PHQ-9 Score 0 1 2 - -  Difficult doing work/chores - Not difficult at all Not difficult at all - -   PHQ-2/9 Result is negative.    Fall Risk: Fall Risk  06/06/2018 10/19/2017 07/19/2017 04/15/2017 03/24/2017  Falls in the past year? 0 No Yes No No  Number falls in past yr: 0 - 1 - -  Injury with Fall? 0 - Yes - -  Follow  up - - Falls evaluation completed - -     Assessment & Plan  1. Essential hypertension, benign  Doing well and still has refills   2. GAD (generalized anxiety disorder)  She states well controlled with Duloxetine  3. Prediabetes  Discussed dietary modifications  4. Pure hypercholesterolemia  Taking crestor but not daily, recheck labs next visit   5. Positive ANA (antinuclear antibody)  Now having joint effusions, advised to follow up with Dr. Renard Matter   6. Vitamin D deficiency  Continue supplementation   I discussed the assessment and treatment plan with the patient. The patient was provided an opportunity to ask questions and all were answered. The patient agreed with the plan and demonstrated an understanding of the instructions.  The patient was advised to call back or seek an in-person evaluation if the symptoms worsen or if the condition fails to improve as anticipated.  I provided 25  minutes of non-face-to-face time during this encounter.

## 2018-07-07 ENCOUNTER — Encounter: Payer: Self-pay | Admitting: Family Medicine

## 2018-07-10 ENCOUNTER — Other Ambulatory Visit: Payer: Self-pay | Admitting: Family Medicine

## 2018-07-10 DIAGNOSIS — M2550 Pain in unspecified joint: Secondary | ICD-10-CM

## 2018-07-10 DIAGNOSIS — R7689 Other specified abnormal immunological findings in serum: Secondary | ICD-10-CM

## 2018-07-10 DIAGNOSIS — M2560 Stiffness of unspecified joint, not elsewhere classified: Secondary | ICD-10-CM

## 2018-07-10 DIAGNOSIS — R768 Other specified abnormal immunological findings in serum: Secondary | ICD-10-CM

## 2018-07-13 ENCOUNTER — Encounter: Payer: Self-pay | Admitting: Family Medicine

## 2018-07-27 ENCOUNTER — Other Ambulatory Visit: Payer: Self-pay | Admitting: Family Medicine

## 2018-08-04 ENCOUNTER — Other Ambulatory Visit: Payer: Self-pay | Admitting: Family Medicine

## 2018-08-04 DIAGNOSIS — I10 Essential (primary) hypertension: Secondary | ICD-10-CM

## 2018-08-04 DIAGNOSIS — F411 Generalized anxiety disorder: Secondary | ICD-10-CM

## 2018-09-07 ENCOUNTER — Other Ambulatory Visit: Payer: Self-pay

## 2018-09-07 ENCOUNTER — Ambulatory Visit
Admission: RE | Admit: 2018-09-07 | Discharge: 2018-09-07 | Disposition: A | Discharge status: Home / Self Care | Payer: Federal, State, Local not specified - PPO | Source: Ambulatory Visit | Attending: Obstetrics and Gynecology | Admitting: Obstetrics and Gynecology

## 2018-09-07 DIAGNOSIS — Z1239 Encounter for other screening for malignant neoplasm of breast: Secondary | ICD-10-CM | POA: Diagnosis not present

## 2018-09-07 DIAGNOSIS — Z1231 Encounter for screening mammogram for malignant neoplasm of breast: Secondary | ICD-10-CM | POA: Diagnosis not present

## 2018-10-02 ENCOUNTER — Telehealth: Payer: Self-pay | Admitting: Family Medicine

## 2018-10-02 NOTE — Telephone Encounter (Signed)
Pt has an appt.

## 2018-10-02 NOTE — Telephone Encounter (Signed)
Attempted to reach the office, 3 times. The line was busy each time. The patient is calling the office to schedule an app for an insect bite. Please advise CB- [phone]

## 2018-10-04 ENCOUNTER — Encounter: Payer: Self-pay | Admitting: Nurse Practitioner

## 2018-10-04 ENCOUNTER — Ambulatory Visit: Payer: Federal, State, Local not specified - PPO | Admitting: Nurse Practitioner

## 2018-10-04 ENCOUNTER — Other Ambulatory Visit: Payer: Self-pay

## 2018-10-04 VITALS — BP 122/80 | HR 78 | Temp 97.3°F | Resp 14 | Ht 64.0 in | Wt 158.1 lb

## 2018-10-04 DIAGNOSIS — B354 Tinea corporis: Secondary | ICD-10-CM

## 2018-10-04 DIAGNOSIS — R7303 Prediabetes: Secondary | ICD-10-CM | POA: Diagnosis not present

## 2018-10-04 DIAGNOSIS — Z5181 Encounter for therapeutic drug level monitoring: Secondary | ICD-10-CM

## 2018-10-04 MED ORDER — TERBINAFINE HCL 250 MG PO TABS: 250.0000 mg | ORAL_TABLET | Freq: Every day | ORAL | 0 refills | Status: DC

## 2018-10-04 NOTE — Patient Instructions (Signed)
- Start medications once you receive results about liver function take it daily for 2 weeks. If you are not noticing improvement within one week please call dermatology - also checking A1C due to prediabetes in last levels, work on increasing protein and decreasing carbs in diet.  Foods and drinks to limit include  . fried foods and other foods high in saturated fat and trans fat  . foods high in salt, also called sodium  . sweets, such as baked goods, candy, and ice cream  . beverages with added sugars, such as juice, regular soda, and regular sports or energy drinks  Drink water instead of sweetened beverages. Consider using a sugar substitute in your coffee or tea.   Instead, eat carbohydrates from fruit, vegetables, whole grains, beans, and low-fat or nonfat milk. Choose healthy carbohydrates, such as fruit, vegetables, whole grains, beans, and low-fat milk.  Body Ringworm Body ringworm is an infection of the skin that often causes a ring-shaped rash. Body ringworm is also called tinea corporis. Body ringworm can affect any part of your skin. This condition is easily spread from person to person (is very contagious). What are the causes? This condition is caused by fungi called dermatophytes. The condition develops when these fungi grow out of control on the skin. You can get this condition if you touch a person or animal that has it. You can also get it if you share any items with an infected person or pet. These include:  Clothing, bedding, and towels.  Brushes or combs.  Gym equipment.  Any other object that has the fungus on it. What increases the risk? You are more likely to develop this condition if you:  Play sports that involve close physical contact, such as wrestling.  Sweat a lot.  Live in areas that are hot and humid.  Use public showers.  Have a weakened immune system. What are the signs or symptoms? Symptoms of this condition include:  Itchy, raised red  spots and bumps.  Red scaly patches.  A ring-shaped rash. The rash may have: ? A clear center. ? Scales or red bumps at its center. ? Redness near its borders. ? Dry and scaly skin on or around it. How is this diagnosed? This condition can usually be diagnosed with a skin exam. A skin scraping may be taken from the affected area and examined under a microscope to see if the fungus is present. How is this treated? This condition may be treated with:  An antifungal cream or ointment.  An antifungal shampoo.  Antifungal medicines. These may be prescribed if your ringworm: ? Is severe. ? Keeps coming back. ? Lasts a long time. Follow these instructions at home:  Take over-the-counter and prescription medicines only as told by your health care provider.  If you were given an antifungal cream or ointment: ? Use it as told by your health care provider. ? Wash the infected area and dry it completely before applying the cream or ointment.  If you were given an antifungal shampoo: ? Use it as told by your health care provider. ? Leave the shampoo on your body for 3-5 minutes before rinsing.  While you have a rash: ? Wear loose clothing to stop clothes from rubbing and irritating it. ? Wash or change your bed sheets every night. ? Disinfect or throw out items that may be infected. ? Wash clothes and bed sheets in hot water. ? Wash your hands often with soap and water. If soap  and water are not available, use hand sanitizer.  If your pet has the same infection, take your pet to see a veterinarian for treatment. How is this prevented?  Take a bath or shower every day and after every time you work out or play sports.  Dry your skin completely after bathing.  Wear sandals or shoes in public places and showers.  Change your clothes every day.  Wash athletic clothes after each use.  Do not share personal items with others.  Avoid touching red patches of skin on other  people.  Avoid touching pets that have bald spots.  If you touch an animal that has a bald spot, wash your hands. Contact a health care provider if:  Your rash continues to spread after 7 days of treatment.  Your rash is not gone in 4 weeks.  The area around your rash gets red, warm, tender, and swollen. Summary  Body ringworm is an infection of the skin that often causes a ring-shaped rash.  This condition is easily spread from person to person (is very contagious).  This condition may be treated with antifungal cream or ointment, antifungal shampoo, or antifungal medicines.  Take over-the-counter and prescription medicines only as told by your health care provider. This information is not intended to replace advice given to you by your health care provider. Make sure you discuss any questions you have with your health care provider. Document Released: 02/20/2000 Document Revised: 10/21/2017 Document Reviewed: 10/21/2017 Elsevier Patient Education  2020 [employer].

## 2018-10-04 NOTE — Progress Notes (Signed)
Name: Savannah Richards   MRN: [id number]    DOB: [birth year]   Date:10/04/2018       Progress Note  Subjective  Chief Complaint  Chief Complaint  Patient presents with  . Insect Bite    Onset around a month ago, continually spreading.    HPI Patient noticed rash to right upper chest first week of July states was small dime sized red area.  States area continue to increase in size and was itchy.  She tried a prednisone taper and her son's Kenalog cream with no relief of symptoms.  Area continue to grow to quarter size and then to golf ball size area.  States on Saturday she started to use an antifungal cream and noticed area became more red and itchy.  She stopped using the cream and areas continue to grow.     PHQ2/9: [facility] 10/04/2018 06/06/2018 10/19/2017 07/19/2017 04/15/2017  Decreased Interest 0 0 0 0 0  Down, Depressed, Hopeless 0 0 0 0 0  PHQ - 2 Score 0 0 0 0 0  Altered sleeping 0 0 0 0 -  Tired, decreased energy 0 0 1 2 -  Change in appetite 0 0 0 0 -  Feeling bad or failure about yourself  0 0 0 0 -  Trouble concentrating 0 0 0 0 -  Moving slowly or fidgety/restless 0 0 0 0 -  Suicidal thoughts 0 0 0 0 -  PHQ-9 Score 0 0 1 2 -  Difficult doing work/chores Not difficult at all - Not difficult at all Not difficult at all -     PHQ reviewed. Negative  Patient Active Problem List   Diagnosis Date Noted  . GAD (generalized anxiety disorder) 10/19/2017  . Irritable bowel syndrome with diarrhea 07/19/2017  . Positive ANA (antinuclear antibody) 05/16/2017  . History of total vaginal hysterectomy (TVH) 05/11/2017  . History of back surgery 08/19/2016  . Aortic valve sclerosis 08/03/2016  . Gastroesophageal reflux disease 07/23/2016  . Essential hypertension, benign 07/08/2015  . Vitamin D insufficiency 05/28/2015  . Cardiac murmur 02/18/2015  . Prediabetes 12/11/2014  . Hyperlipidemia 12/11/2014  . Anxiety 10/30/2014    Past Medical History:   Diagnosis Date  . Anxiety   . Dyspareunia   . Elevated hemoglobin (HCC)   . Elevated lipids   . Endometriosis   . GERD (gastroesophageal reflux disease)   . Hematuria   . Herniated cervical disc   . Hypertension   . IBS (irritable bowel syndrome)   . Infertility, female   . Irregular periods   . Ovarian cyst   . Pelvic adhesions   . Urinary urgency   . Vaginal discharge   . Vaginal Pap smear, abnormal     Past Surgical History:  Procedure Laterality Date  . CESAREAN SECTION     x 2  . HERNIA REPAIR     umbilicus  . MICRODISCECTOMY LUMBAR  08/19/2016   L4-L5  . MOLE REMOVAL     multiple  . VAGINAL HYSTERECTOMY  10/2011    Social History   Tobacco Use  . Smoking status: Never Smoker  . Smokeless tobacco: Never Used  Substance Use Topics  . Alcohol use: No    Alcohol/week: 0.0 standard drinks     Current Outpatient Medications:  .  cetirizine (ZYRTEC) 10 MG tablet, Take by mouth as needed. , Disp: , Rfl:  .  Cholecalciferol (VITAMIN D) 2000 units CAPS, Take 1 capsule (2,000 Units total) by  mouth daily., Disp: 30 capsule, Rfl: 0 .  dicyclomine (BENTYL) 10 MG capsule, Take 10 mg by mouth as needed. , Disp: , Rfl:  .  DULoxetine (CYMBALTA) 60 MG capsule, TAKE (1) CAPSULE BY MOUTH ONCE DAILY., Disp: 30 capsule, Rfl: 2 .  hydrOXYzine (ATARAX/VISTARIL) 10 MG tablet, Take 1-2 tablets (10-20 mg total) by mouth 3 (three) times daily as needed., Disp: 30 tablet, Rfl: 0 .  losartan (COZAAR) 100 MG tablet, TAKE 1 TABLET BY MOUTH ONCE DAILY., Disp: 30 tablet, Rfl: 2 .  rosuvastatin (CRESTOR) 20 MG tablet, TAKE 1 TABLET BY MOUTH ONCE DAILY., Disp: 90 tablet, Rfl: 0  Allergies  Allergen Reactions  . Ciprofloxacin Hcl Other (See Comments)  . Shellfish Allergy   . Sulfamethoxazole-Trimethoprim Other (See Comments)    ROS  No other specific complaints in a complete review of systems (except as listed in HPI above).  Objective  Vitals:   10/04/18 0954  BP: 122/80   Pulse: 78  Resp: 14  Temp: (!) 97.3 F (36.3 C)  TempSrc: Temporal  SpO2: 98%  Weight: 158 lb 1.6 oz (71.7 kg)  Height: 5\' 4"  (1.626 m)     Body mass index is 27.14 kg/m.  Nursing Note and Vital Signs reviewed.  Physical Exam Constitutional:      Appearance: Normal appearance.  HENT:     Head: Normocephalic and atraumatic.  Cardiovascular:     Rate and Rhythm: Normal rate.  Pulmonary:     Effort: Pulmonary effort is normal.  Skin:      Neurological:     General: No focal deficit present.     Mental Status: She is alert and oriented to person, place, and time.  Psychiatric:        Mood and Affect: Mood normal.        Behavior: Behavior normal.        No results found for this or any previous visit (from the past 48 hour(s)).  Assessment & Plan  1. Tinea corporis - terbinafine (LAMISIL) 250 MG tablet; Take 1 tablet (250 mg total) by mouth daily.  Dispense: 14 tablet; Refill: 0  2. Medication monitoring encounter - COMPLETE METABOLIC PANEL WITH GFR  3. Prediabetes Diet info provided  - HgB A1c

## 2018-10-05 LAB — HEMOGLOBIN A1C
Hgb A1c MFr Bld: 5.7 % of total Hgb — ABNORMAL HIGH (ref <5.7)
Mean Plasma Glucose: 117 (calc)
eAG (mmol/L): 6.5 (calc)

## 2018-10-05 LAB — COMPLETE METABOLIC PANEL WITH GFR
AG Ratio: 1.4 (calc) (ref 1.0–2.5)
ALT: 9 U/L (ref 6–29)
AST: 11 U/L (ref 10–30)
Albumin: 4.2 g/dL (ref 3.6–5.1)
Alkaline phosphatase (APISO): 54 U/L (ref 31–125)
BUN: 14 mg/dL (ref 7–25)
CO2: 29 mmol/L (ref 20–32)
Calcium: 9.8 mg/dL (ref 8.6–10.2)
Chloride: 104 mmol/L (ref 98–110)
Creat: 0.6 mg/dL (ref 0.50–1.10)
GFR, Est African American: 132 mL/min/{1.73_m2} (ref >=60)
GFR, Est Non African American: 114 mL/min/{1.73_m2} (ref >=60)
Globulin: 3 g/dL (calc) (ref 1.9–3.7)
Glucose, Bld: 92 mg/dL (ref 65–99)
Potassium: 4.6 mmol/L (ref 3.5–5.3)
Sodium: 137 mmol/L (ref 135–146)
Total Bilirubin: 0.3 mg/dL (ref 0.2–1.2)
Total Protein: 7.2 g/dL (ref 6.1–8.1)

## 2018-10-30 DIAGNOSIS — Z79899 Other long term (current) drug therapy: Secondary | ICD-10-CM | POA: Diagnosis not present

## 2018-10-30 DIAGNOSIS — M797 Fibromyalgia: Secondary | ICD-10-CM | POA: Diagnosis not present

## 2018-10-30 DIAGNOSIS — R768 Other specified abnormal immunological findings in serum: Secondary | ICD-10-CM | POA: Diagnosis not present

## 2018-10-30 DIAGNOSIS — M25542 Pain in joints of left hand: Secondary | ICD-10-CM | POA: Diagnosis not present

## 2018-10-30 DIAGNOSIS — M25541 Pain in joints of right hand: Secondary | ICD-10-CM | POA: Diagnosis not present

## 2018-11-02 ENCOUNTER — Other Ambulatory Visit: Payer: Self-pay | Admitting: Family Medicine

## 2018-11-02 DIAGNOSIS — I10 Essential (primary) hypertension: Secondary | ICD-10-CM

## 2018-11-02 DIAGNOSIS — F411 Generalized anxiety disorder: Secondary | ICD-10-CM

## 2018-11-02 NOTE — Telephone Encounter (Signed)
Requests approved per protocol. Patient overdue PHQ-2 or PHQ-9. Reminder placed in notes of future visit.  Requested Prescriptions  Pending Prescriptions Disp Refills  . losartan (COZAAR) 100 MG tablet [Pharmacy Med Name: LOSARTAN POTASSIUM 100 MG TAB] 30 tablet 0    Sig: TAKE 1 TABLET BY MOUTH ONCE DAILY.     Cardiovascular:  Angiotensin Receptor Blockers Passed - 11/02/2018 10:44 AM      Passed - Cr in normal range and within 180 days    Creat  Date Value Ref Range Status  10/04/2018 0.60 0.50 - 1.10 mg/dL Final         Passed - K in normal range and within 180 days    Potassium  Date Value Ref Range Status  10/04/2018 4.6 3.5 - 5.3 mmol/L Final  09/13/2011 4.4 3.5 - 5.1 mmol/L Final         Passed - Patient is not pregnant      Passed - Last BP in normal range    BP Readings from Last 1 Encounters:  10/04/18 122/80         Passed - Valid encounter within last 6 months    Recent Outpatient Visits          4 weeks ago Tinea corporis   [place], NP   4 months ago Essential hypertension, benign   [facility] Steele Sizer, MD   1 year ago Essential hypertension, benign   [place], [place], [place]   1 year ago Irritability   [facility] Steele Sizer, MD   1 year ago Well woman exam (no gynecological exam)   [place], MD      Future Appointments            In 3 weeks Steele Sizer, MD [facility], [place]   In 6 months Rubie Maid, MD [facility]           . DULoxetine (CYMBALTA) 60 MG capsule [Pharmacy Med Name: DULOXETINE HCL DR 60 MG CAP] 30 capsule 0    Sig: TAKE (1) CAPSULE BY MOUTH ONCE DAILY.     Psychiatry: Antidepressants - SNRI Failed - 11/02/2018 10:44 AM      Failed - Completed PHQ-2 or PHQ-9 in the last 360 days.      Passed - Last BP in normal range    BP Readings  from Last 1 Encounters:  10/04/18 122/80         Passed - Valid encounter within last 6 months    Recent Outpatient Visits          4 weeks ago Tinea corporis   [place], NP   4 months ago Essential hypertension, benign   [facility] Steele Sizer, MD   1 year ago Essential hypertension, benign   [place], [place], [place]   1 year ago [facility] Steele Sizer, MD   1 year ago Well woman exam (no gynecological exam)   [place], MD      Future Appointments            In 3 weeks Steele Sizer, MD [facility], [place]   In 6 months Rubie Maid, MD [facility]

## 2018-11-28 ENCOUNTER — Encounter: Payer: Self-pay | Admitting: Family Medicine

## 2018-11-28 ENCOUNTER — Ambulatory Visit: Payer: Federal, State, Local not specified - PPO | Admitting: Family Medicine

## 2018-11-28 ENCOUNTER — Other Ambulatory Visit: Payer: Self-pay

## 2018-11-28 VITALS — BP 116/72 | HR 83 | Temp 97.5°F | Resp 16 | Wt 142.0 lb

## 2018-11-28 DIAGNOSIS — E7800 Pure hypercholesterolemia, unspecified: Secondary | ICD-10-CM

## 2018-11-28 DIAGNOSIS — Z83719 Family history of colon polyps, unspecified: Secondary | ICD-10-CM

## 2018-11-28 DIAGNOSIS — Z23 Encounter for immunization: Secondary | ICD-10-CM | POA: Diagnosis not present

## 2018-11-28 DIAGNOSIS — I10 Essential (primary) hypertension: Secondary | ICD-10-CM

## 2018-11-28 DIAGNOSIS — M2560 Stiffness of unspecified joint, not elsewhere classified: Secondary | ICD-10-CM

## 2018-11-28 DIAGNOSIS — E559 Vitamin D deficiency, unspecified: Secondary | ICD-10-CM

## 2018-11-28 DIAGNOSIS — F411 Generalized anxiety disorder: Secondary | ICD-10-CM | POA: Diagnosis not present

## 2018-11-28 DIAGNOSIS — R7303 Prediabetes: Secondary | ICD-10-CM

## 2018-11-28 DIAGNOSIS — M797 Fibromyalgia: Secondary | ICD-10-CM

## 2018-11-28 DIAGNOSIS — R768 Other specified abnormal immunological findings in serum: Secondary | ICD-10-CM

## 2018-11-28 DIAGNOSIS — Z8371 Family history of colonic polyps: Secondary | ICD-10-CM

## 2018-11-28 LAB — LIPID PANEL
Cholesterol: 251 mg/dL — ABNORMAL HIGH (ref <200)
HDL: 58 mg/dL (ref >=50)
LDL Cholesterol (Calc): 173 mg/dL (calc) — ABNORMAL HIGH
Non-HDL Cholesterol (Calc): 193 mg/dL (calc) — ABNORMAL HIGH (ref <130)
Total CHOL/HDL Ratio: 4.3 (calc) (ref <5.0)
Triglycerides: 89 mg/dL (ref <150)

## 2018-11-28 MED ORDER — LOSARTAN POTASSIUM 100 MG PO TABS: 100.0000 mg | ORAL_TABLET | Freq: Every day | ORAL | 1 refills | Status: DC

## 2018-11-28 MED ORDER — ROSUVASTATIN CALCIUM 20 MG PO TABS: 20.0000 mg | ORAL_TABLET | Freq: Every day | ORAL | 1 refills | Status: DC

## 2018-11-28 MED ORDER — DULOXETINE HCL 60 MG PO CPEP: 60.0000 mg | ORAL_CAPSULE | Freq: Every day | ORAL | 1 refills | Status: DC

## 2018-11-28 MED ORDER — GABAPENTIN 100 MG PO CAPS: 100.0000 mg | ORAL_CAPSULE | Freq: Two times a day (BID) | ORAL | 0 refills | Status: DC

## 2018-11-28 NOTE — Progress Notes (Signed)
Name: Savannah Richards   MRN: [id number]    DOB: 12-30-1975   Date:11/28/2018       Progress Note  Subjective  Chief Complaint  Chief Complaint  Patient presents with  . Follow-up    6 Month Follow Up    HPI  HTN: had multiple tests done, including for rule out of hyperaldosteronism or pheochromocytoma. BP is at goal today, no dizziness, chest pain or palpitation   Hyperlipidemia: last LDL was 207 , not on medication, positive family history of heart disease, maternal grandmother had a stroke in her 21's, maternal uncles MI's in their 14's. Mother has HTN and hyperlipidemia. She is only on life style modifcation and Crestor, recent liver enzymes normal at rheumatologist office we will recheck lipid panel, she skips Crestor a couple of times a week because she forgets to take it at night, advised to take it in am's and she has changed her diet, eating every 2 hours, a lean protein plus 3 green vegetables and has lost 16 lbs since 09/2018. She started diet about 2 months ago. She is feeling well with dietary modification.   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 She states currently doing well, takes Bentyl very seldom, Bristol scale of 4, no abdominal cramping at this time, and not missing work. She states having children, hysterectomy and cymbalta seems to have helped with symptoms. She will check with insurance to see if they will cover colonoscopy at her age  GAD: doing well on higher dose of Cymbalta and not using hydroxyzine at this time  Arthralgia and FMS : positive ANA, she initially saw seen by Dr. Renard Matter but given reassurance and advised tylenol and ibuprofen prn, we rechecked labs back in 07/2017 and sed rate was elevated at 38 but normal CRP at 2.0    Symptoms got worse over the Spring 2020  , she decided to go to [facility] appointment was 10/2018 and  was given 5 weeks of prednisone taper and Gabapentin 100 mg. She states medications helped her significantly, joints on her fingers finally went down in size and pain was controlled, but off prednisone for the past week and her body is aching again, burning and fingers are swelling up again, based on Dr. Aundria Rud note they are planning on adding hydroxychloroquine, we will increase gabapentin to BID.   Prediabetes: last A1C was done 10/2017 and it was 5.7%  denies polyphagia, polyuria or polydipsia.   Patient Active Problem List   Diagnosis Date Noted  . GAD (generalized anxiety disorder) 10/19/2017  . Irritable bowel syndrome with diarrhea 07/19/2017  . Positive ANA (antinuclear antibody) 05/16/2017  . History of total vaginal hysterectomy (TVH) 05/11/2017  . History of back surgery 08/19/2016  . Aortic valve sclerosis 08/03/2016  . Gastroesophageal reflux disease 07/23/2016  . Essential hypertension, benign 07/08/2015  . Vitamin D insufficiency 05/28/2015  . Cardiac murmur 02/18/2015  . Prediabetes 12/11/2014  . Hyperlipidemia 12/11/2014  . Anxiety 10/30/2014    Past Surgical History:  Procedure Laterality Date  . CESAREAN SECTION     x 2  . HERNIA REPAIR     umbilicus  . MICRODISCECTOMY LUMBAR  08/19/2016   L4-L5  . MOLE REMOVAL     multiple  . VAGINAL HYSTERECTOMY  10/2011    Family History  Problem Relation Age of Onset  . Osteoporosis Mother   . Endometriosis Mother   . Hypertension Mother   . Diabetes Father   . Heart disease Father   . Hypertension Father   . Colon polyps Father   . Thyroid disease Sister   . Hypertension Brother   . Hyperlipidemia Brother   . Diabetes Maternal Grandfather   . Colon cancer Paternal Grandmother   . Diabetes Paternal Grandfather   . Stroke Maternal Grandmother   . Breast cancer Neg Hx   . Ovarian cancer Neg Hx     Social  History   Socioeconomic History  . Marital status: Married    Spouse name: Not on file  . Number of children: 3  . Years of education: Not on file  . Highest education level: Associate degree: occupational, Hotel manager, or vocational program  Occupational History  . Occupation: Occupational psychologist   Social Needs  . Financial resource strain: Not hard at all  . Food insecurity    Worry: Never true    Inability: Never true  . Transportation needs    Medical: No    Non-medical: No  Tobacco Use  . Smoking status: Never Smoker  . Smokeless tobacco: Never Used  Substance and Sexual Activity  . Alcohol use: No    Alcohol/week: 0.0 standard drinks  . Drug use: No  . Sexual activity: Yes    Partners: Female    Birth control/protection: Surgical  Lifestyle  . Physical activity    Days per week: 3 days    Minutes per session: 40 min  . Stress: Not at all  Relationships  . Social connections    Talks on phone: More than three times a week    Gets together: More than three times a week    Attends religious service: More than 4 times per year    Active member of club or organization: Yes    Attends meetings of clubs or organizations: More than 4 times per year    Relationship status: Married  . Intimate partner violence    Fear of current or ex partner: No    Emotionally abused: No    Physically abused: No    Forced sexual activity: No  Other Topics Concern  . Not on file  Social History Narrative  . Not on file     Current Outpatient Medications:  .  cetirizine (ZYRTEC) 10 MG tablet, Take by mouth as needed. , Disp: , Rfl:  .  Cholecalciferol (VITAMIN D) 2000 units CAPS, Take 1 capsule (2,000 Units total) by mouth daily., Disp: 30 capsule, Rfl: 0 .  DULoxetine (CYMBALTA) 60 MG capsule, Take 1 capsule (60 mg total) by mouth daily., Disp: 90 capsule, Rfl: 1 .  gabapentin (NEURONTIN) 100 MG capsule, Take 1 capsule (100 mg total) by mouth 2 (two) times daily., Disp: 180 capsule, Rfl:  0 .  hydrOXYzine (ATARAX/VISTARIL) 10 MG tablet, Take 1-2 tablets (10-20 mg total) by mouth 3 (three) times daily as needed., Disp: 30 tablet, Rfl: 0 .  losartan (COZAAR) 100 MG tablet, Take 1 tablet (100 mg total) by mouth daily., Disp: 90 tablet, Rfl: 1 .  rosuvastatin (CRESTOR) 20 MG tablet, Take 1 tablet (20 mg total) by mouth daily. 6 days a week, Disp: 90 tablet, Rfl: 1 .  dicyclomine (BENTYL) 10 MG capsule, Take 10 mg by mouth as needed. , Disp: , Rfl:   Allergies  Allergen Reactions  .  Ciprofloxacin Hcl Other (See Comments)  . Shellfish Allergy   . Sulfamethoxazole-Trimethoprim Other (See Comments)    I personally reviewed active problem list, medication list, allergies, family history, social history, health maintenance with the patient/caregiver today.   ROS  Constitutional: Negative for fever, positive for  weight change.  Respiratory: Negative for cough and shortness of breath.   Cardiovascular: Negative for chest pain or palpitations.  Gastrointestinal: Negative for abdominal pain, no bowel changes. (normal IBS problems)  Musculoskeletal: Negative for gait problem, positive for joint swelling.  Skin: Negative for rash.  Neurological: Negative for dizziness or headache.  No other specific complaints in a complete review of systems (except as listed in HPI above).   Objective  Vitals:   11/28/18 0748  BP: 116/72  Pulse: 83  Resp: 16  Temp: (!) 97.5 F (36.4 C)  SpO2: 98%  Weight: 142 lb (64.4 kg)    Body mass index is 24.37 kg/m.  Physical Exam  Constitutional: Patient appears well-developed and well-nourished.  No distress.  HEENT: head atraumatic, normocephalic, pupils equal and reactive to light Cardiovascular: Normal rate, regular rhythm and normal heart sounds.  No murmur heard. No BLE edema. Pulmonary/Chest: Effort normal and breath sounds normal. No respiratory distress. Abdominal: Soft.  There is no tenderness. Psychiatric: Patient has a normal  mood and affect. behavior is normal. Judgment and thought content normal. Muscular Skeletal: trigger point positive, mild swelling on both hands but no redness or increase in warmth   Recent Results (from the past 2160 hour(s))  COMPLETE METABOLIC PANEL WITH GFR     Status: None   Collection Time: 10/04/18 10:39 AM  Result Value Ref Range   Glucose, Bld 92 65 - 99 mg/dL    Comment: .            Fasting reference interval .    BUN 14 7 - 25 mg/dL   Creat 6.570.60 8.460.50 - 9.621.10 mg/dL   GFR, Est Non African American 114 > OR = 60 mL/min/1.4273m2   GFR, Est African American 132 > OR = 60 mL/min/1.3273m2   BUN/Creatinine Ratio NOT APPLICABLE 6 - 22 (calc)   Sodium 137 135 - 146 mmol/L   Potassium 4.6 3.5 - 5.3 mmol/L   Chloride 104 98 - 110 mmol/L   CO2 29 20 - 32 mmol/L   Calcium 9.8 8.6 - 10.2 mg/dL   Total Protein 7.2 6.1 - 8.1 g/dL   Albumin 4.2 3.6 - 5.1 g/dL   Globulin 3.0 1.9 - 3.7 g/dL (calc)   AG Ratio 1.4 1.0 - 2.5 (calc)   Total Bilirubin 0.3 0.2 - 1.2 mg/dL   Alkaline phosphatase (APISO) 54 31 - 125 U/L   AST 11 10 - 30 U/L   ALT 9 6 - 29 U/L  Hemoglobin A1c     Status: Abnormal   Collection Time: 10/04/18 10:39 AM  Result Value Ref Range   Hgb A1c MFr Bld 5.7 (H) <5.7 % of total Hgb    Comment: For someone without known diabetes, a hemoglobin  A1c value between 5.7% and 6.4% is consistent with prediabetes and should be confirmed with a  follow-up test. . For someone with known diabetes, a value <7% indicates that their diabetes is well controlled. A1c targets should be individualized based on duration of diabetes, age, comorbid conditions, and other considerations. . This assay result is consistent with an increased risk of diabetes. . Currently, no consensus exists regarding use of hemoglobin A1c for diagnosis of  diabetes for children. .    Mean Plasma Glucose 117 (calc)   eAG (mmol/L) 6.5 (calc)     PHQ2/9: Depression screen [facility] 2/9 11/28/2018 10/04/2018  06/06/2018 10/19/2017 07/19/2017  Decreased Interest 0 0 0 0 0  Down, Depressed, Hopeless 0 0 0 0 0  PHQ - 2 Score 0 0 0 0 0  Altered sleeping 0 0 0 0 0  Tired, decreased energy 1 0 0 1 2  Change in appetite 0 0 0 0 0  Feeling bad or failure about yourself  0 0 0 0 0  Trouble concentrating 0 0 0 0 0  Moving slowly or fidgety/restless 0 0 0 0 0  Suicidal thoughts 0 0 0 0 0  PHQ-9 Score 1 0 0 1 2  Difficult doing work/chores Not difficult at all Not difficult at all - Not difficult at all Not difficult at all    phq 9 is  negative   Fall Risk: Fall Risk  11/28/2018 10/04/2018 06/06/2018 10/19/2017 07/19/2017  Falls in the past year? 0 0 0 No Yes  Number falls in past yr: 0 0 0 - 1  Injury with Fall? 0 0 0 - Yes  Follow up - - - - Falls evaluation completed     Functional Status Survey: Is the patient deaf or have difficulty hearing?: No Does the patient have difficulty seeing, even when wearing glasses/contacts?: No Does the patient have difficulty concentrating, remembering, or making decisions?: No Does the patient have difficulty walking or climbing stairs?: No Does the patient have difficulty dressing or bathing?: No Does the patient have difficulty doing errands alone such as visiting a doctor's office or shopping?: No    Assessment & Plan  1. Positive ANA (antinuclear antibody)  Under the care of Dr. Aundria Rud  2. Morning joint stiffness of multiple sites   3. GAD (generalized anxiety disorder)  - DULoxetine (CYMBALTA) 60 MG capsule; Take 1 capsule (60 mg total) by mouth daily.  Dispense: 90 capsule; Refill: 1  4. Pure hypercholesterolemia  - rosuvastatin (CRESTOR) 20 MG tablet; Take 1 tablet (20 mg total) by mouth daily. 6 days a week  Dispense: 90 tablet; Refill: 1 - Lipid panel  5. Prediabetes  On life style modification   6. Vitamin D deficiency  Taking supplementation   7. Essential hypertension  - losartan (COZAAR) 100 MG tablet; Take 1 tablet (100 mg  total) by mouth daily.  Dispense: 90 tablet; Refill: 1  8. Need for Tdap vaccination  tdap  9. Family history of colonic polyps  She will check on insurance to see if they pay for colon cancer screen at her age   45. Fibromyalgia  - DULoxetine (CYMBALTA) 60 MG capsule; Take 1 capsule (60 mg total) by mouth daily.  Dispense: 90 capsule; Refill: 1 - gabapentin (NEURONTIN) 100 MG capsule; Take 1 capsule (100 mg total) by mouth 2 (two) times daily.  Dispense: 180 capsule; Refill: 0

## 2019-03-29 ENCOUNTER — Other Ambulatory Visit: Payer: Self-pay | Admitting: Family Medicine

## 2019-03-29 DIAGNOSIS — F411 Generalized anxiety disorder: Secondary | ICD-10-CM

## 2019-03-29 NOTE — Telephone Encounter (Signed)
Requested medication (s) are due for refill today: yes  Requested medication (s) are on the active medication list: yes  Last refill: 07/19/17  Future visit scheduled: yes  Notes to clinic:  expired RX    Requested Prescriptions  Pending Prescriptions Disp Refills   hydrOXYzine (ATARAX/VISTARIL) 10 MG tablet [Pharmacy Med Name: HYDROXYZINE HCL 10 MG TABLET] 30 tablet 0    Sig: TAKE (1) OR (2) TABLETS BY MOUTH THREE TIMES DAILY AS NEEDED.      Ear, Nose, and Throat:  Antihistamines Passed - 03/29/2019  2:15 PM      Passed - Valid encounter within last 12 months    Recent Outpatient Visits           4 months ago Positive ANA (antinuclear antibody)   [facility] Alba Cory, MD   5 months ago Tinea corporis   [facility] Cheryle Horsfall, NP   9 months ago Essential hypertension, benign   [facility] Alba Cory, MD   1 year ago Essential hypertension, benign   [facility] Doren Custard, [place]   1 year ago Irritability   [facility] Alba Cory, MD       Future Appointments             In 1 month Hildred Laser, MD [facility]   In 2 months Alba Cory, MD [facility]

## 2019-04-02 DIAGNOSIS — M359 Systemic involvement of connective tissue, unspecified: Secondary | ICD-10-CM | POA: Diagnosis not present

## 2019-04-02 DIAGNOSIS — Z79899 Other long term (current) drug therapy: Secondary | ICD-10-CM | POA: Diagnosis not present

## 2019-04-02 DIAGNOSIS — Z5181 Encounter for therapeutic drug level monitoring: Secondary | ICD-10-CM | POA: Diagnosis not present

## 2019-04-19 ENCOUNTER — Telehealth: Payer: Self-pay

## 2019-04-19 DIAGNOSIS — Z5181 Encounter for therapeutic drug level monitoring: Secondary | ICD-10-CM | POA: Diagnosis not present

## 2019-04-19 DIAGNOSIS — M359 Systemic involvement of connective tissue, unspecified: Secondary | ICD-10-CM | POA: Diagnosis not present

## 2019-04-19 DIAGNOSIS — Z79899 Other long term (current) drug therapy: Secondary | ICD-10-CM | POA: Diagnosis not present

## 2019-04-19 NOTE — Telephone Encounter (Signed)
Copied from CRM [id number]. Topic: General - Call Back - No Documentation >> Apr 19, 2019 11:31 AM Randol Kern wrote: Reason for CRM: Heart palpitations, SOB, rash on arms elbow to arms itches, inflamed. If she eats gluten it makes the rash worse, itches really bad.  Best contact: [phone]   Called patient. Advised her to go to [facility] for treatment or she can see another PCP in this office. She would prefer to see Dr. Carlynn Purl. Her kids have the same thing. She would like something in the evening next week.

## 2019-04-19 NOTE — Telephone Encounter (Signed)
Spoke with pt and she said that this has been going on since she had covid so its not an emergency. She will keep her March appt. I did inform her that I will keep her name for a cancellation list because she is wanting a late afternoon. Pt was in agreement

## 2019-04-20 ENCOUNTER — Ambulatory Visit: Payer: Federal, State, Local not specified - PPO | Admitting: Family Medicine

## 2019-04-20 ENCOUNTER — Other Ambulatory Visit: Payer: Self-pay

## 2019-04-20 ENCOUNTER — Encounter: Payer: Self-pay | Admitting: Family Medicine

## 2019-04-20 VITALS — BP 110/80 | HR 99 | Temp 97.3°F | Resp 16 | Ht 64.0 in | Wt 141.6 lb

## 2019-04-20 DIAGNOSIS — M797 Fibromyalgia: Secondary | ICD-10-CM

## 2019-04-20 DIAGNOSIS — R7303 Prediabetes: Secondary | ICD-10-CM

## 2019-04-20 DIAGNOSIS — R21 Rash and other nonspecific skin eruption: Secondary | ICD-10-CM | POA: Diagnosis not present

## 2019-04-20 DIAGNOSIS — Z8616 Personal history of COVID-19: Secondary | ICD-10-CM

## 2019-04-20 DIAGNOSIS — R0602 Shortness of breath: Secondary | ICD-10-CM | POA: Diagnosis not present

## 2019-04-20 DIAGNOSIS — R002 Palpitations: Secondary | ICD-10-CM

## 2019-04-20 DIAGNOSIS — I10 Essential (primary) hypertension: Secondary | ICD-10-CM

## 2019-04-20 DIAGNOSIS — F411 Generalized anxiety disorder: Secondary | ICD-10-CM

## 2019-04-20 DIAGNOSIS — E7800 Pure hypercholesterolemia, unspecified: Secondary | ICD-10-CM

## 2019-04-20 MED ORDER — ROSUVASTATIN CALCIUM 20 MG PO TABS: 20.0000 mg | ORAL_TABLET | Freq: Every day | ORAL | 0 refills | Status: DC

## 2019-04-20 MED ORDER — DULOXETINE HCL 60 MG PO CPEP: 60.0000 mg | ORAL_CAPSULE | Freq: Every day | ORAL | 0 refills | Status: DC

## 2019-04-20 MED ORDER — GABAPENTIN 100 MG PO CAPS: 100.0000 mg | ORAL_CAPSULE | Freq: Two times a day (BID) | ORAL | 0 refills | Status: DC

## 2019-04-20 MED ORDER — LOSARTAN POTASSIUM 100 MG PO TABS: 100.0000 mg | ORAL_TABLET | Freq: Every day | ORAL | 0 refills | Status: DC

## 2019-04-20 MED ORDER — TRIAMCINOLONE ACETONIDE 0.1 % EX CREA: 1.0000 "application " | TOPICAL_CREAM | Freq: Two times a day (BID) | CUTANEOUS | 0 refills | Status: AC

## 2019-04-20 NOTE — Addendum Note (Signed)
Addended by: Cynda Familia on: 04/20/2019 02:41 PM   Modules accepted: Orders

## 2019-04-20 NOTE — Progress Notes (Signed)
Name: Savannah Richards   MRN: [id number]    DOB: 10-15-1975   Date:04/20/2019       Progress Note  Subjective  Chief Complaint  Chief Complaint  Patient presents with  . Rash    bilateral on arms from elbows to wrist. Started the day after she was dx with Covid.  . Palpitations    She has heart palpitations and sob since being dx with covid and has not resolved.    HPI  History of COVID-19: she was diagnosed with COVID-19 on 03/19/2019, she states she had a fever for one day, SOB for a couple of days and very mild, sinus congestion, body aches and lost sense of taste and smell, symptoms resolved after 4-5 days except for lack of taste and smell that lasted longer and still has some intermittent changes of sense of taste.  Rash on both arms: described as itchy started one day after COVID-19. She has autoimmune disorder but is the first time she had a rash like this. She has also noticed that symptoms are worse when she eats food with Gluten. She has not tried any topical medications for that. Only on both arms/ventral aspect  Palpitation and decrease in exercise tolerance: also started post-COVID, started about one week after onset. Mild when walking fast, but this week she went up a hill and felt very sob and felt like vomiting. She also has frequent episodes of palpitations. She states worse when laying flat so she has been using extra pillows, no orthopnea, no lower extremity edema. She has seen a cardiologist in the past   Prediabetes:last A1C was done 09/2018 was  5.7%denies polyphagia, polyuria or polydipsia.  HTN: had multiple tests done, including for rule out of hyperaldosteronism or pheochromocytoma. BP is at goal today with current regiment , no dizziness, chest pain or palpitation   Hyperlipidemia: last LDL was 207 , not on medication, positive family history of heart disease, maternal grandmother had a stroke in her 24's, maternal uncles MI's in their 40's. 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 She states currently doing well, takes Bentyl very seldom, Bristol scale of 4, no abdominal cramping at this time, and not missing work. She states having children, hysterectomy and cymbalta seems to have helped with symptoms.She states mother had a sessile polyp and will need to go back for colonoscopy in 3 months, she will give me diagnosis so we can refer her for colonoscopy   GAD: doing well on higher dose of Cymbalta , taking hydroxyzine seldom a few times a month   Arthralgia and FMS : positive ANA, she initially saw seen by Dr. Renard Matter but given reassurance and advised tylenol and ibuprofen prn,we rechecked labs back in 07/2017 and sed rate was elevated at 38 but normal CRP at 2.0Symptoms got worse over the Spring 2020  , she decided to go to [facility] appointment was 10/2018 and  was given 5 weeks of prednisone taper and Gabapentin 100 mg. She states medications helped her significantly, joints on her fingers finally went down in size and pain was controlled, but off prednisone for the past week and her  body is aching again, burning and fingers are swelling up again, she saw Dr. Aundria Rud end of 2020 and was started on Plaquenil she is feeling much better since, pain right now is 0/10, states that sometimes wakes up with pain but resolves quickly   Patient Active Problem List   Diagnosis Date Noted  . GAD (generalized anxiety disorder) 10/19/2017  . Irritable bowel syndrome with diarrhea 07/19/2017  . Positive ANA (antinuclear antibody) 05/16/2017  . History of total vaginal hysterectomy (TVH) 05/11/2017  . History of back surgery  08/19/2016  . Aortic valve sclerosis 08/03/2016  . Gastroesophageal reflux disease 07/23/2016  . Essential hypertension, benign 07/08/2015  . Vitamin D insufficiency 05/28/2015  . Cardiac murmur 02/18/2015  . Prediabetes 12/11/2014  . Hyperlipidemia 12/11/2014  . Anxiety 10/30/2014    Past Surgical History:  Procedure Laterality Date  . CESAREAN SECTION     x 2  . HERNIA REPAIR     umbilicus  . MICRODISCECTOMY LUMBAR  08/19/2016   L4-L5  . MOLE REMOVAL     multiple  . VAGINAL HYSTERECTOMY  10/2011    Family History  Problem Relation Age of Onset  . Osteoporosis Mother   . Endometriosis Mother   . Hypertension Mother   . Diabetes Father   . Heart disease Father   . Hypertension Father   . Colon polyps Father   . Thyroid disease Sister   . Hypertension Brother   . Hyperlipidemia Brother   . Diabetes Maternal Grandfather   . Colon cancer Paternal Grandmother   . Diabetes Paternal Grandfather   . Stroke Maternal Grandmother   . Breast cancer Neg Hx   . Ovarian cancer Neg Hx     Social History   Tobacco Use  . Smoking status: Never Smoker  . Smokeless tobacco: Never Used  Substance Use Topics  . Alcohol use: No    Alcohol/week: 0.0 standard drinks  . Drug use: No     Current Outpatient Medications:  .  cetirizine (ZYRTEC) 10 MG tablet, Take by mouth as needed. , Disp: , Rfl:  .  Cholecalciferol (VITAMIN D) 2000 units CAPS, Take 1 capsule (2,000 Units total) by mouth daily., Disp: 30 capsule, Rfl: 0 .  dicyclomine (BENTYL) 10 MG capsule, Take 10 mg by mouth as needed. , Disp: , Rfl:  .  DULoxetine (CYMBALTA) 60 MG capsule, Take 1 capsule (60 mg total) by mouth daily., Disp: 90 capsule, Rfl: 1 .  gabapentin (NEURONTIN) 100 MG capsule, Take 1 capsule (100 mg total) by mouth 2 (two) times daily., Disp: 180 capsule, Rfl: 0 .  hydroxychloroquine (PLAQUENIL) 200 MG tablet, Take by mouth., Disp: , Rfl:  .  hydrOXYzine (ATARAX/VISTARIL) 10 MG tablet, TAKE (1) OR (2)  TABLETS BY MOUTH THREE TIMES DAILY AS NEEDED., Disp: 30 tablet, Rfl: 0 .  losartan (COZAAR) 100 MG tablet, Take 1 tablet (100 mg total) by mouth daily., Disp: 90 tablet, Rfl: 1 .  rosuvastatin (CRESTOR) 20 MG tablet, Take 1 tablet (20 mg total) by mouth daily. 6 days a week, Disp: 90 tablet, Rfl: 1 .  triamcinolone cream (KENALOG) 0.1 %, Apply 1 application topically 2 (two) times daily., Disp: 453.6 g, Rfl: 0  Allergies  Allergen Reactions  . Ciprofloxacin Hcl Other (See Comments)  . Shellfish Allergy   . Sulfamethoxazole-Trimethoprim Other (See Comments)    I personally reviewed active problem list, medication list, allergies, family history, social history with the patient/caregiver today.   ROS  Constitutional: Negative for fever  or weight change.  Respiratory: Negative for cough and shortness of breath.   Cardiovascular: Negative for chest pain or palpitations.  Gastrointestinal: Negative for abdominal pain, no bowel changes.  Musculoskeletal: Negative for gait problem or joint swelling.  Skin: Negative for rash.  Neurological: Negative for dizziness or headache.  No other specific complaints in a complete review of systems (except as listed in HPI above).  Objective  Vitals:   04/20/19 1359  BP: 110/80  Pulse: 99  Resp: 16  Temp: (!) 97.3 F (36.3 C)  TempSrc: Temporal  SpO2: 99%  Weight: 141 lb 9.6 oz (64.2 kg)  Height: 5\' 4"  (1.626 m)    Body mass index is 24.31 kg/m.  Physical Exam  Constitutional: Patient appears well-developed and well-nourished.No distress.  HEENT: head atraumatic, normocephalic, pupils equal and reactive to light Cardiovascular: Normal rate, regular rhythm and normal heart sounds.  No murmur heard. No BLE edema. Pulmonary/Chest: Effort normal and breath sounds normal. No respiratory distress. Abdominal: Soft.  There is no tenderness. Psychiatric: Patient has a normal mood and affect. behavior is normal. Judgment and thought content  normal. Skin: rash on both ventral arms, it blanches , does not seem to be hives  PHQ2/9: Depression screen [facility] 2/9 04/20/2019 11/28/2018 10/04/2018 06/06/2018 10/19/2017  Decreased Interest 0 0 0 0 0  Down, Depressed, Hopeless 0 0 0 0 0  PHQ - 2 Score 0 0 0 0 0  Altered sleeping 0 0 0 0 0  Tired, decreased energy 0 1 0 0 1  Change in appetite 0 0 0 0 0  Feeling bad or failure about yourself  0 0 0 0 0  Trouble concentrating 0 0 0 0 0  Moving slowly or fidgety/restless 0 0 0 0 0  Suicidal thoughts 0 0 0 0 0  PHQ-9 Score 0 1 0 0 1  Difficult doing work/chores - Not difficult at all Not difficult at all - Not difficult at all  Some recent data might be hidden    phq 9 is negative   Fall Risk: Fall Risk  04/20/2019 11/28/2018 10/04/2018 06/06/2018 10/19/2017  Falls in the past year? 0 0 0 0 No  Number falls in past yr: 0 0 0 0 -  Injury with Fall? 0 0 0 0 -  Follow up - - - - -    Functional Status Survey: Is the patient deaf or have difficulty hearing?: No Does the patient have difficulty seeing, even when wearing glasses/contacts?: No Does the patient have difficulty concentrating, remembering, or making decisions?: No Does the patient have difficulty walking or climbing stairs?: No Does the patient have difficulty dressing or bathing?: No Does the patient have difficulty doing errands alone such as visiting a doctor's office or shopping?: No   Assessment & Plan  1. Palpitation  - Ambulatory referral to Cardiology  2. History of COVID-19  - Ambulatory referral to Cardiology  3. SOB (shortness of breath) on exertion  - Ambulatory referral to Cardiology  4. Rash in adult  - triamcinolone cream (KENALOG) 0.1 %; Apply 1 application topically 2 (two) times daily.  Dispense: 453.6 g; Refill: 0 - Gliadin protein   5. Prediabetes  - Hemoglobin A1c   6. Pure hypercholesterolemia  - rosuvastatin (CRESTOR) 20 MG tablet; Take 1 tablet (20 mg total) by mouth daily. 6 days a  week  Dispense: 90 tablet; Refill: 0  7. Essential hypertension  - losartan (COZAAR) 100 MG tablet; Take 1 tablet (100 mg total)  by mouth daily.  Dispense: 90 tablet; Refill: 0  8. GAD (generalized anxiety disorder)  - DULoxetine (CYMBALTA) 60 MG capsule; Take 1 capsule (60 mg total) by mouth daily.  Dispense: 90 capsule; Refill: 0  9. Fibromyalgia  - DULoxetine (CYMBALTA) 60 MG capsule; Take 1 capsule (60 mg total) by mouth daily.  Dispense: 90 capsule; Refill: 0 - gabapentin (NEURONTIN) 100 MG capsule; Take 1 capsule (100 mg total) by mouth 2 (two) times daily.  Dispense: 180 capsule; Refill: 0

## 2019-04-24 LAB — HEMOGLOBIN A1C
Hgb A1c MFr Bld: 5.4 % of total Hgb (ref <5.7)
Mean Plasma Glucose: 108 (calc)
eAG (mmol/L): 6 (calc)

## 2019-04-24 LAB — GLIADIN ANTIBODIES, SERUM
Gliadin IgA: <1 Units
Gliadin IgG: 2 Units

## 2019-05-25 ENCOUNTER — Encounter: Payer: Federal, State, Local not specified - PPO | Admitting: Obstetrics and Gynecology

## 2019-05-29 ENCOUNTER — Ambulatory Visit: Payer: Federal, State, Local not specified - PPO | Admitting: Family Medicine

## 2019-06-04 ENCOUNTER — Encounter: Payer: Self-pay | Admitting: Family Medicine

## 2019-06-06 DIAGNOSIS — I10 Essential (primary) hypertension: Secondary | ICD-10-CM | POA: Diagnosis not present

## 2019-06-06 DIAGNOSIS — R002 Palpitations: Secondary | ICD-10-CM | POA: Diagnosis not present

## 2019-06-06 DIAGNOSIS — E782 Mixed hyperlipidemia: Secondary | ICD-10-CM | POA: Diagnosis not present

## 2019-06-12 DIAGNOSIS — L308 Other specified dermatitis: Secondary | ICD-10-CM | POA: Diagnosis not present

## 2019-06-12 DIAGNOSIS — L578 Other skin changes due to chronic exposure to nonionizing radiation: Secondary | ICD-10-CM | POA: Diagnosis not present

## 2019-06-12 DIAGNOSIS — Z86018 Personal history of other benign neoplasm: Secondary | ICD-10-CM | POA: Diagnosis not present

## 2019-06-12 DIAGNOSIS — L720 Epidermal cyst: Secondary | ICD-10-CM | POA: Diagnosis not present

## 2019-07-02 DIAGNOSIS — R002 Palpitations: Secondary | ICD-10-CM | POA: Diagnosis not present

## 2019-07-03 NOTE — Progress Notes (Deleted)
Pt present for annual exam.  

## 2019-07-04 ENCOUNTER — Encounter: Payer: Federal, State, Local not specified - PPO | Admitting: Obstetrics and Gynecology

## 2019-07-04 DIAGNOSIS — R002 Palpitations: Secondary | ICD-10-CM | POA: Diagnosis not present

## 2019-07-04 DIAGNOSIS — I10 Essential (primary) hypertension: Secondary | ICD-10-CM | POA: Diagnosis not present

## 2019-07-11 ENCOUNTER — Encounter: Payer: Self-pay | Admitting: Family Medicine

## 2019-07-11 ENCOUNTER — Other Ambulatory Visit: Payer: Self-pay | Admitting: Family Medicine

## 2019-07-11 DIAGNOSIS — F411 Generalized anxiety disorder: Secondary | ICD-10-CM

## 2019-07-11 MED ORDER — BUSPIRONE HCL 5 MG PO TABS: 5.0000 mg | ORAL_TABLET | Freq: Three times a day (TID) | ORAL | 0 refills | Status: DC | PRN

## 2019-07-13 ENCOUNTER — Other Ambulatory Visit: Payer: Self-pay | Admitting: Family Medicine

## 2019-07-13 DIAGNOSIS — E7800 Pure hypercholesterolemia, unspecified: Secondary | ICD-10-CM

## 2019-07-13 DIAGNOSIS — I10 Essential (primary) hypertension: Secondary | ICD-10-CM

## 2019-07-13 DIAGNOSIS — F411 Generalized anxiety disorder: Secondary | ICD-10-CM

## 2019-07-13 DIAGNOSIS — M797 Fibromyalgia: Secondary | ICD-10-CM

## 2019-07-13 NOTE — Telephone Encounter (Signed)
Requested Prescriptions  Pending Prescriptions Disp Refills  . gabapentin (NEURONTIN) 100 MG capsule [Pharmacy Med Name: GABAPENTIN 100 MG CAPSULE] 60 capsule 0    Sig: TAKE (1) CAPSULE BY MOUTH TWICE DAILY.     Neurology: Anticonvulsants - gabapentin Passed - 07/13/2019  8:38 AM      Passed - Valid encounter within last 12 months    Recent Outpatient Visits          2 months ago Palpitation   [facility] Alba Cory, MD   7 months ago Positive ANA (antinuclear antibody)   [facility] Alba Cory, MD   9 months ago Tinea corporis   [facility] Cheryle Horsfall, NP   1 year ago Essential hypertension, benign   [facility] Alba Cory, MD   1 year ago Essential hypertension, benign   [facility] Doren Custard, [place]      Future Appointments            In 1 week Alba Cory, MD [facility]           . DULoxetine (CYMBALTA) 60 MG capsule [Pharmacy Med Name: DULOXETINE HCL DR 60 MG CAP] 30 capsule 0    Sig: TAKE (1) CAPSULE BY MOUTH ONCE DAILY.     Psychiatry: Antidepressants - SNRI Passed - 07/13/2019  8:38 AM      Passed - Last BP in normal range    BP Readings from Last 1 Encounters:  04/20/19 110/80         Passed - Valid encounter within last 6 months    Recent Outpatient Visits          2 months ago Palpitation   [facility] Alba Cory, MD   7 months ago Positive ANA (antinuclear antibody)   [facility] Alba Cory, MD   9 months ago Tinea corporis   [facility] Cheryle Horsfall, NP   1 year ago Essential hypertension, benign   [facility] Alba Cory, MD   1 year ago Essential hypertension, benign   [facility] Doren Custard, [place]      Future Appointments            In 1 week Alba Cory, MD [facility]           . losartan (COZAAR) 100 MG tablet [Pharmacy Med Name: LOSARTAN POTASSIUM 100 MG TAB] 30 tablet 0    Sig: TAKE 1 TABLET BY MOUTH ONCE DAILY.     Cardiovascular:  Angiotensin Receptor Blockers Failed - 07/13/2019  8:38 AM      Failed - Cr in normal range and within 180 days    Creat  Date Value Ref Range Status  10/04/2018 0.60 0.50 - 1.10 mg/dL Final         Failed - K in normal range and within 180 days    Potassium  Date Value Ref Range Status  10/04/2018 4.6 3.5 - 5.3 mmol/L Final  09/13/2011 4.4 3.5 - 5.1 mmol/L Final         Passed - Patient is not pregnant      Passed - Last BP in normal range    BP Readings from Last 1 Encounters:  04/20/19 110/80         Passed - Valid encounter within last 6 months    Recent Outpatient Visits  2 months ago [facility] Steele Sizer, MD   7 months ago Positive ANA (antinuclear antibody)   [facility] Steele Sizer, MD   9 months ago Tinea corporis   [place], NP   1 year ago Essential hypertension, benign   [facility] Steele Sizer, MD   1 year ago Essential hypertension, benign   [place], [place], [place]      Future Appointments            In 1 week Steele Sizer, MD [facility], [place]           . rosuvastatin (CRESTOR) 20 MG tablet [Pharmacy Med Name: ROSUVASTATIN CALCIUM 20 MG TAB] 30 tablet 0    Sig: TAKE 1 TABLET BY MOUTH ONCE DAILY.     Cardiovascular:  Antilipid - Statins Failed - 07/13/2019  8:38 AM      Failed - Total Cholesterol in normal range and within 360 days    Cholesterol, Total  Date Value Ref Range Status  05/28/2015 209 (H) 100 - 199 mg/dL Final   Cholesterol  Date Value Ref Range Status  11/28/2018 251 (H) <200 mg/dL Final         Failed - LDL in normal range and within 360  days    LDL Cholesterol (Calc)  Date Value Ref Range Status  11/28/2018 173 (H) mg/dL (calc) Final    Comment:    Reference range: <100 . Desirable range <100 mg/dL for primary prevention;   <70 mg/dL for patients with CHD or diabetic patients  with > or = 2 CHD risk factors. Marland Kitchen LDL-C is now calculated using the Martin-Hopkins  calculation, which is a validated novel method providing  better accuracy than the Friedewald equation in the  estimation of LDL-C.  Cresenciano Genre et al. Annamaria Helling. 4098;119(14): 2061-2068  (http://education.[employer].com/faq/FAQ164)          Passed - HDL in normal range and within 360 days    HDL  Date Value Ref Range Status  11/28/2018 58 > OR = 50 mg/dL Final  05/28/2015 52 >39 mg/dL Final         Passed - Triglycerides in normal range and within 360 days    Triglycerides  Date Value Ref Range Status  11/28/2018 89 <150 mg/dL Final         Passed - Patient is not pregnant      Passed - Valid encounter within last 12 months    Recent Outpatient Visits          2 months ago [facility] Steele Sizer, MD   7 months ago Positive ANA (antinuclear antibody)   [facility] Steele Sizer, MD   9 months ago Tinea corporis   [place], [place]   1 year ago Essential hypertension, benign   [facility] Steele Sizer, MD   1 year ago Essential hypertension, benign   [place], [place]      Future Appointments            In 1 week Steele Sizer, MD [facility] [place], [facility]

## 2019-07-20 ENCOUNTER — Other Ambulatory Visit: Payer: Self-pay

## 2019-07-20 ENCOUNTER — Ambulatory Visit: Payer: Federal, State, Local not specified - PPO | Admitting: Family Medicine

## 2019-07-20 ENCOUNTER — Encounter: Payer: Self-pay | Admitting: Family Medicine

## 2019-07-20 VITALS — BP 118/84 | HR 90 | Temp 97.1°F | Resp 16 | Ht 64.0 in | Wt 144.9 lb

## 2019-07-20 DIAGNOSIS — R768 Other specified abnormal immunological findings in serum: Secondary | ICD-10-CM

## 2019-07-20 DIAGNOSIS — M797 Fibromyalgia: Secondary | ICD-10-CM

## 2019-07-20 DIAGNOSIS — M359 Systemic involvement of connective tissue, unspecified: Secondary | ICD-10-CM

## 2019-07-20 DIAGNOSIS — F411 Generalized anxiety disorder: Secondary | ICD-10-CM

## 2019-07-20 DIAGNOSIS — K580 Irritable bowel syndrome with diarrhea: Secondary | ICD-10-CM

## 2019-07-20 DIAGNOSIS — R002 Palpitations: Secondary | ICD-10-CM | POA: Diagnosis not present

## 2019-07-20 DIAGNOSIS — I10 Essential (primary) hypertension: Secondary | ICD-10-CM | POA: Diagnosis not present

## 2019-07-20 DIAGNOSIS — E7800 Pure hypercholesterolemia, unspecified: Secondary | ICD-10-CM

## 2019-07-20 DIAGNOSIS — R7303 Prediabetes: Secondary | ICD-10-CM

## 2019-07-20 MED ORDER — ROSUVASTATIN CALCIUM 20 MG PO TABS: 20.0000 mg | ORAL_TABLET | Freq: Every day | ORAL | 0 refills | Status: DC

## 2019-07-20 MED ORDER — DULOXETINE HCL 60 MG PO CPEP: 60.0000 mg | ORAL_CAPSULE | Freq: Every day | ORAL | 0 refills | Status: DC

## 2019-07-20 MED ORDER — DICYCLOMINE HCL 10 MG PO CAPS: 10.0000 mg | ORAL_CAPSULE | Freq: Three times a day (TID) | ORAL | 0 refills | Status: AC | PRN

## 2019-07-20 MED ORDER — GABAPENTIN 100 MG PO CAPS: 100.0000 mg | ORAL_CAPSULE | Freq: Four times a day (QID) | ORAL | 2 refills | Status: DC

## 2019-07-20 MED ORDER — LOSARTAN POTASSIUM 100 MG PO TABS: 100.0000 mg | ORAL_TABLET | Freq: Every day | ORAL | 0 refills | Status: DC

## 2019-07-20 NOTE — Progress Notes (Signed)
Name: Savannah Richards   MRN: [id number]    DOB: 13-Mar-1975   Date:07/20/2019       Progress Note  Subjective  Chief Complaint  Chief Complaint  Patient presents with  . Hypertension  . Palpitations    She thinks Buspar is causing palpitations. She has stopped taking this medication  . 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 Buspar not working for her and she also stopped taking Hydroxyzine.    HPI   History of COVID-19: she was diagnosed with COVID-19 on 03/19/2019, she states she had a fever for one day, SOB for a couple of days and very mild, sinus congestion, body aches and lost sense of taste and smell, symptoms resolved after 4-5 days except for lack of taste and smell that lasted longer , still has occasional mental fogginess but doing better, mild sob but also improving, palpitation is still present   Palpitation and decrease in exercise tolerance: also started post-COVID, started about one week after onset - diagnosed first week of January. She has been evaluated by cardiologist, Dr. Allena Katz in April and holter monitor negative. She states palpitation is still frequent, but not daily, usually more frequent at work. She states sob is gradually improving.   Prediabetes:last A1C was done 09/2018 was  5.7%denies polyphagia, polyuria or polydipsia.Following a diabetic diet   HTN: had multiple tests done, including for rule out of hyperaldosteronism or pheochromocytoma.BP is at goal today with current regiment, she denies orthostatic changes. No chest pain recently but has palpitation   Hyperlipidemia: last LDL was207, not on medication, positive family history of heart disease, maternal grandmother had a stroke in her 48's, maternal uncles MI's in their 42's. Mother has HTN and hyperlipidemia.She is only on life style modifcation and Crestor, she denies myalgia from medication. LDL was still high last time at 173.   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 She states currently doing well, takes Bentyl very seldom, Bristol scale of 4, no abdominal cramping at this time, and not missing work. She states having children, hysterectomy and cymbalta seems to have helped with symptoms.She needs a refill of Bentyl   GAD: doing well on higher dose of Cymbalta , since last visit she has seen Dr. Allena Katz ( cardiologist ) and holter monitor showed sinus rhythm. She states Buspar made her feel more moody. Hydroxyzine makes her sleepy and disconnected. She has tried Zoloft but it caused hair loss, lexapro made her emotionless. She states work has been very stressful. She works at [employer], but since they started giving vaccine , one of the pharmacist was pulled from the floor and she has to do her job and also help others out. She is feeling overwhelmed. She does not feel supported at work at this time. She is able to be calm at home, no losing her patience with her kids. We discussed 10 minutes of self care daily and we will increase gabapentin to 100 mg qid   Arthralgia and FMS: positive ANA, she initially sawseen by Dr. Renard Matter but given reassurance and advised tylenol and ibuprofen prn,we rechecked labs back in 07/2017 and sed rate was elevated at 38 but normal CRP at 2.0Symptoms got  worse over the Spring 2020 , she decided to go to [place] appointment was 10/2018 and was given 5 weeks of prednisone taper and Gabapentin 100 mg. She states medications helped her significantly, joints on her fingers finally went down in size and pain was controlled, but off prednisone for the past week and her body is aching again, burning and fingers are swelling up again, she saw Dr. Stann Mainland end of 2020 and was started on  Plaquenil she is feeling much better since, pain right now is  3/10, but improves as the day progresses. No swelling or increase in warmth of joints  Eczema: seen by dermatologist and using Halog , it has helped   Patient Active Problem List   Diagnosis Date Noted  . GAD (generalized anxiety disorder) 10/19/2017  . Irritable bowel syndrome with diarrhea 07/19/2017  . Positive ANA (antinuclear antibody) 05/16/2017  . History of total vaginal hysterectomy (TVH) 05/11/2017  . History of back surgery 08/19/2016  . Aortic valve sclerosis 08/03/2016  . Gastroesophageal reflux disease 07/23/2016  . Essential hypertension, benign 07/08/2015  . Vitamin D insufficiency 05/28/2015  . Cardiac murmur 02/18/2015  . Prediabetes 12/11/2014  . Hyperlipidemia 12/11/2014  . Anxiety 10/30/2014    Past Surgical History:  Procedure Laterality Date  . CESAREAN SECTION     x 2  . HERNIA REPAIR     umbilicus  . MICRODISCECTOMY LUMBAR  08/19/2016   L4-L5  . MOLE REMOVAL     multiple  . VAGINAL HYSTERECTOMY  10/2011    Family History  Problem Relation Age of Onset  . Osteoporosis Mother   . Endometriosis Mother   . Hypertension Mother   . Diabetes Father   . Heart disease Father   . Hypertension Father   . Colon polyps Father   . Thyroid disease Sister   . Hypertension Brother   . Hyperlipidemia Brother   . Diabetes Maternal Grandfather   . Colon cancer Paternal Grandmother   . Diabetes Paternal Grandfather   . Stroke Maternal Grandmother   . Breast cancer Neg Hx   . Ovarian cancer Neg Hx     Social History   Tobacco Use  . Smoking status: Never Smoker  . Smokeless tobacco: Never Used  Substance Use Topics  . Alcohol use: No    Alcohol/week: 0.0 standard drinks     Current Outpatient Medications:  .  busPIRone (BUSPAR) 5 MG tablet, Take 1 tablet (5 mg total) by mouth 3 (three) times daily as needed., Disp: 60 tablet, Rfl: 0 .  cetirizine (ZYRTEC) 10 MG tablet, Take by mouth as  needed. , Disp: , Rfl:  .  Cholecalciferol (VITAMIN D) 2000 units CAPS, Take 1 capsule (2,000 Units total) by mouth daily., Disp: 30 capsule, Rfl: 0 .  dicyclomine (BENTYL) 10 MG capsule, Take 10 mg by mouth as needed. , Disp: , Rfl:  .  DULoxetine (CYMBALTA) 60 MG capsule, TAKE (1) CAPSULE BY MOUTH ONCE DAILY., Disp: 30 capsule, Rfl: 0 .  gabapentin (NEURONTIN) 100 MG capsule, TAKE (1) CAPSULE BY MOUTH TWICE DAILY., Disp: 60 capsule, Rfl: 0 .  hydroxychloroquine (PLAQUENIL) 200 MG tablet, Take by mouth., Disp: , Rfl:  .  hydrOXYzine (ATARAX/VISTARIL) 10 MG tablet, TAKE (1) OR (2) TABLETS BY MOUTH THREE TIMES DAILY AS NEEDED., Disp: 30 tablet, Rfl: 0 .  losartan (COZAAR) 100 MG tablet, TAKE 1 TABLET BY MOUTH ONCE DAILY., Disp: 30 tablet, Rfl: 0 .  rosuvastatin (CRESTOR) 20 MG tablet, TAKE 1 TABLET BY MOUTH  ONCE DAILY., Disp: 30 tablet, Rfl: 0 .  triamcinolone cream (KENALOG) 0.1 %, Apply 1 application topically 2 (two) times daily., Disp: 453.6 g, Rfl: 0  Allergies  Allergen Reactions  . Ciprofloxacin Hcl Other (See Comments)  . Shellfish Allergy   . Sulfamethoxazole-Trimethoprim Other (See Comments)    I personally reviewed active problem list, medication list, allergies, family history, social history, health maintenance with the patient/caregiver today.   ROS  Constitutional: Negative for fever or weight change.  Respiratory: Negative for cough and shortness of breath.   Cardiovascular: Positive  for intermittent chest pain and  palpitations.  Gastrointestinal: Negative for abdominal pain, no bowel changes.  Musculoskeletal: Negative for gait problem or joint swelling.  Skin: Negative for rash.  Neurological: Negative for dizziness or headache.  No other specific complaints in a complete review of systems (except as listed in HPI above).  Objective  Vitals:   07/20/19 0811  BP: 118/84  Pulse: 90  Resp: 16  Temp: (!) 97.1 F (36.2 C)  TempSrc: Temporal  SpO2: 100%   Weight: 144 lb 14.4 oz (65.7 kg)  Height: 5\' 4"  (1.626 m)    Body mass index is 24.87 kg/m.  Physical Exam  Constitutional: Patient appears well-developed and well-nourished. No distress.  HEENT: head atraumatic, normocephalic, pupils equal and reactive to light,  neck supple, throat within normal limits Cardiovascular: Normal rate, regular rhythm and normal heart sounds.  No murmur heard. No BLE edema. Pulmonary/Chest: Effort normal and breath sounds normal. No respiratory distress. Abdominal: Soft.  There is no tenderness. Psychiatric: Patient has a normal mood and affect. behavior is normal. Judgment and thought content normal.  PHQ2/9: Depression screen [facility] 2/9 07/20/2019 04/20/2019 11/28/2018 10/04/2018 06/06/2018  Decreased Interest 0 0 0 0 0  Down, Depressed, Hopeless 0 0 0 0 0  PHQ - 2 Score 0 0 0 0 0  Altered sleeping 0 0 0 0 0  Tired, decreased energy 0 0 1 0 0  Change in appetite 0 0 0 0 0  Feeling bad or failure about yourself  0 0 0 0 0  Trouble concentrating 0 0 0 0 0  Moving slowly or fidgety/restless 0 0 0 0 0  Suicidal thoughts 0 0 0 0 0  PHQ-9 Score 0 0 1 0 0  Difficult doing work/chores - - Not difficult at all Not difficult at all -  Some recent data might be hidden    phq 9 is negative  GAD 7 : Generalized Anxiety Score 07/20/2019 06/06/2018 10/19/2017 10/19/2017  Nervous, Anxious, on Edge 2 1 0 0  Control/stop worrying 0 0 0 0  Worry too much - different things 0 0 0 0  Trouble relaxing 1 0 0 0  Restless 0 0 0 0  Easily annoyed or irritable 2 1 0 0  Afraid - awful might happen 0 0 0 0  Total GAD 7 Score 5 2 0 0  Anxiety Difficulty - Not difficult at all Not difficult at all Not difficult at all     Fall Risk: Fall Risk  07/20/2019 04/20/2019 11/28/2018 10/04/2018 06/06/2018  Falls in the past year? 0 0 0 0 0  Number falls in past yr: 0 0 0 0 0  Injury with Fall? 0 0 0 0 0  Follow up - - - - -      Functional Status Survey: Is the patient deaf or  have difficulty hearing?: No Does the patient have difficulty seeing, even when wearing glasses/contacts?:  No Does the patient have difficulty concentrating, remembering, or making decisions?: No Does the patient have difficulty walking or climbing stairs?: No Does the patient have difficulty dressing or bathing?: No Does the patient have difficulty doing errands alone such as visiting a doctor's office or shopping?: No    Assessment & Plan  1. GAD (generalized anxiety disorder)  - DULoxetine (CYMBALTA) 60 MG capsule; Take 1 capsule (60 mg total) by mouth daily.  Dispense: 90 capsule; Refill: 0  2. Palpitation  Reassurance   3. Essential hypertension, benign  - losartan (COZAAR) 100 MG tablet; Take 1 tablet (100 mg total) by mouth daily.  Dispense: 90 tablet; Refill: 0  4. Fibromyalgia  - gabapentin (NEURONTIN) 100 MG capsule; Take 1 capsule (100 mg total) by mouth 4 (four) times daily.  Dispense: 120 capsule; Refill: 2 - DULoxetine (CYMBALTA) 60 MG capsule; Take 1 capsule (60 mg total) by mouth daily.  Dispense: 90 capsule; Refill: 0  5. Pure hypercholesterolemia  - rosuvastatin (CRESTOR) 20 MG tablet; Take 1 tablet (20 mg total) by mouth daily.  Dispense: 90 tablet; Refill: 0  6. Irritable bowel syndrome with diarrhea  - dicyclomine (BENTYL) 10 MG capsule; Take 1 capsule (10 mg total) by mouth 3 (three) times daily as needed.  Dispense: 90 capsule; Refill: 0  7. Prediabetes   8. Positive ANA (antinuclear antibody)  Doing well on Plaquenil   9. Connective tissue disease (HCC)  Doing well on plaquenil

## 2019-08-11 ENCOUNTER — Other Ambulatory Visit: Payer: Self-pay | Admitting: Family Medicine

## 2019-08-11 NOTE — Telephone Encounter (Signed)
Requested medication (s) are due for refill today: yes  Requested medication (s) are on the active medication list: yes  Last refill:  08/04/18  Future visit scheduled: yes  Notes to clinic:  med 50,000 IU not delegated to NT to RF   Requested Prescriptions  Pending Prescriptions Disp Refills   Cholecalciferol (VITAMIN D3) 50 MCG (2000 UT) TABS [Pharmacy Med Name: VITAMIN D3 2,000 UNIT TABLET] 30 tablet PRN    Sig: TAKE 1 TABLET BY MOUTH ONCE DAILY.      Endocrinology:  Vitamins - Vitamin D Supplementation Failed - 08/11/2019 10:22 AM      Failed - 50,000 IU strengths are not delegated      Failed - Phosphate in normal range and within 360 days    No results found for: PHOS        Failed - Vitamin D in normal range and within 360 days    Vit D, 25-Hydroxy  Date Value Ref Range Status  04/15/2017 18 (L) 30 - 100 ng/mL Final    Comment:    Vitamin D Status         25-OH Vitamin D: . Deficiency:                    <20 ng/mL Insufficiency:             20 - 29 ng/mL Optimal:                 > or = 30 ng/mL . For 25-OH Vitamin D testing on patients on  D2-supplementation and patients for whom quantitation  of D2 and D3 fractions is required, the QuestAssureD(TM) 25-OH VIT D, (D2,D3), LC/MS/MS is recommended: order  code 16109 (patients >53yrs). . For more information on this test, go to: http://education.questdiagnostics.com/faq/FAQ163 (This link is being provided for  informational/educational purposes only.)           Passed - Ca in normal range and within 360 days    Calcium  Date Value Ref Range Status  10/04/2018 9.8 8.6 - 10.2 mg/dL Final   Calcium, Total  Date Value Ref Range Status  09/13/2011 9.3 8.5 - 10.1 mg/dL Final          Passed - Valid encounter within last 12 months    Recent Outpatient Visits           3 weeks ago GAD (generalized anxiety disorder)   [facility] Alba Cory, MD   3 months ago Palpitation   [facility]  [facility] Alba Cory, MD   8 months ago Positive ANA (antinuclear antibody)   [facility] Alba Cory, MD   10 months ago Tinea corporis   [facility] Cheryle Horsfall, NP   1 year ago Essential hypertension, benign   [facility] Alba Cory, MD       Future Appointments             In 2 months Carlynn Purl, Danna Hefty, MD [facility]

## 2019-08-13 ENCOUNTER — Other Ambulatory Visit: Payer: Self-pay

## 2019-08-13 DIAGNOSIS — E559 Vitamin D deficiency, unspecified: Secondary | ICD-10-CM

## 2019-08-13 MED ORDER — VITAMIN D 50 MCG (2000 UT) PO CAPS: 1.0000 | ORAL_CAPSULE | Freq: Every day | ORAL | 1 refills | Status: DC

## 2019-08-20 NOTE — Progress Notes (Signed)
Pt present for vaginal/ovaries pain and bleeding. Pt stated having the pain and bleeding x 1 day with bleeding and off and on with the ovaries pain.

## 2019-08-21 ENCOUNTER — Ambulatory Visit (INDEPENDENT_AMBULATORY_CARE_PROVIDER_SITE_OTHER): Payer: Federal, State, Local not specified - PPO | Admitting: Obstetrics and Gynecology

## 2019-08-21 ENCOUNTER — Encounter: Payer: Self-pay | Admitting: Obstetrics and Gynecology

## 2019-08-21 ENCOUNTER — Other Ambulatory Visit: Payer: Self-pay

## 2019-08-21 VITALS — BP 134/88 | HR 79 | Ht 64.0 in | Wt 146.0 lb

## 2019-08-21 DIAGNOSIS — F329 Major depressive disorder, single episode, unspecified: Secondary | ICD-10-CM

## 2019-08-21 DIAGNOSIS — F419 Anxiety disorder, unspecified: Secondary | ICD-10-CM

## 2019-08-21 DIAGNOSIS — N939 Abnormal uterine and vaginal bleeding, unspecified: Secondary | ICD-10-CM

## 2019-08-21 DIAGNOSIS — Z8742 Personal history of other diseases of the female genital tract: Secondary | ICD-10-CM | POA: Diagnosis not present

## 2019-08-21 DIAGNOSIS — Z90710 Acquired absence of both cervix and uterus: Secondary | ICD-10-CM

## 2019-08-21 DIAGNOSIS — R4586 Emotional lability: Secondary | ICD-10-CM

## 2019-08-21 DIAGNOSIS — R102 Pelvic and perineal pain: Secondary | ICD-10-CM | POA: Diagnosis not present

## 2019-08-21 DIAGNOSIS — F32A Depression, unspecified: Secondary | ICD-10-CM

## 2019-08-21 NOTE — Progress Notes (Signed)
    GYNECOLOGY PROGRESS NOTE  Subjective:    Patient ID: Savannah Richards, female    DOB: 1975-11-19, 44 y.o.   MRN: [id number]  HPI  Patient is a 44 y.o. [phone] female who presents for complaints of pelvic pain (mostly on left side). Last week she noted feeling crampy on the left side and feeling bloating x 1 week, then had 1 day of spotting/light bleeding (dark red). Of note, she has a history of endometriosis, and is s/p TVH. Denies dyspareunia.  She is also noticing more changes in her mood.  She has a history of depression and anxiety, currently on Cymbalta, however notes that there are days where it just seems as though it is not enough.   The following portions of the patient's history were reviewed and updated as appropriate: allergies, current medications, past family history, past medical history, past social history, past surgical history and problem list.  Review of Systems Pertinent items noted in HPI and remainder of comprehensive ROS otherwise negative.   Objective:   Blood pressure 134/88, pulse 79, height 5\' 4"  (1.626 m), weight 146 lb (66.2 kg). General appearance: alert and no distress Abdomen: soft, non-tender; bowel sounds normal; no masses,  no organomegaly Pelvic: external genitalia normal, rectovaginal septum normal.  Vagina with scant thin white discharge (no odor, likely physiologic). Uterus and cervix surgically absent.  Adnexae non-palpable, mildly tender on left.   Extremities: extremities normal, atraumatic, no cyanosis or edema Neurologic: Grossly normal   Assessment:   1. Pelvic pain   2. Vaginal bleeding   3. S/P VH (vaginal hysterectomy)   4. History of endometriosis   5. Mood changes   6. Anxiety and depression    Plan:   1. Discussion had with patient that in light of her age, and her history of endometriosis, it is possible that she may be entering the perimenopausal phase where she may begin to notice more fluctuations in her hormones and  perhaps a reactivation of her endometriosis symptoms (pain, bleeding).  Pelvic exam with no abnormalities except for mild tenderness of the left adnexa (side where patient notes endometriosis was found during her surgery).  Advised on management options including expectant (since this was the first episode), or hormonal suppression. Would not recommend surgical intervention at this time unless symptoms were significant. Patient notes understanding, would like to avoid further surgery unless absolutely necessary.  2. Continue NSAIDs as needed for pain as patient notes this does help.  3. Discussed that with hormonal fluctuations, this could also affect her mood, and could be the cause of the some of the mood changes that she is experiencing that are causing worsening bouts of her depression and anxiety. Again reiterated options of hormonal vs non-hormonal treatments that may be used in conjunction with her Cymbalta.  Patient states that if symptoms worsen, she will consider.  4. To f/u as needed, or if symptoms persist or worsen. Has annual exams with PCP.    , MD [facility]

## 2019-08-21 NOTE — Patient Instructions (Signed)
Pelvic Pain, Female Pelvic pain is pain in your lower belly (abdomen), below your belly button and between your hips. The pain may start suddenly (be acute), keep coming back (be recurring), or last a long time (become chronic). Pelvic pain that lasts longer than 6 months is called chronic pelvic pain. There are many causes of pelvic pain. Sometimes the cause of pelvic pain is not known. Follow these instructions at home:   Take over-the-counter and prescription medicines only as told by your doctor.  Rest as told by your doctor.  Do not have sex if it hurts.  Keep a journal of your pelvic pain. Write down: ? When the pain started. ? Where the pain is located. ? What seems to make the pain better or worse, such as food or your period (menstrual cycle). ? Any symptoms you have along with the pain.  Keep all follow-up visits as told by your doctor. This is important. Contact a doctor if:  Medicine does not help your pain.  Your pain comes back.  You have new symptoms.  You have unusual discharge or bleeding from your vagina.  You have a fever or chills.  You are having trouble pooping (constipation).  You have blood in your pee (urine) or poop (stool).  Your pee smells bad.  You feel weak or light-headed. Get help right away if:  You have sudden pain that is very bad.  Your pain keeps getting worse.  You have very bad pain and also have any of these symptoms: ? A fever. ? Feeling sick to your stomach (nausea). ? Throwing up (vomiting). ? Being very sweaty.  You pass out (lose consciousness). Summary  Pelvic pain is pain in your lower belly (abdomen), below your belly button and between your hips.  There are many possible causes of pelvic pain.  Keep a journal of your pelvic pain. This information is not intended to replace advice given to you by your health care provider. Make sure you discuss any questions you have with your health care provider. Document  Revised: 08/10/2017 Document Reviewed: 08/10/2017 Elsevier Patient Education  [place] is the normal time of life before and after menstrual periods stop completely (menopause). Perimenopause can begin 2-8 years before menopause, and it usually lasts for 1 year after menopause. During perimenopause, the ovaries may or may not produce an egg. What are the causes? This condition is caused by a natural change in hormone levels that happens as you get older. What increases the risk? This condition is more likely to start at an earlier age if you have certain medical conditions or treatments, including:  A tumor of the pituitary gland in the brain.  A disease that affects the ovaries and hormone production.  Radiation treatment for cancer.  Certain cancer treatments, such as chemotherapy or hormone (anti-estrogen) therapy.  Heavy smoking and excessive alcohol use.  Family history of early menopause. What are the signs or symptoms? Perimenopausal changes affect each woman differently. Symptoms of this condition may include:  Hot flashes.  Night sweats.  Irregular menstrual periods.  Decreased sex drive.  Vaginal dryness.  Headaches.  Mood swings.  Depression.  Memory problems or trouble concentrating.  Irritability.  Tiredness.  Weight gain.  Anxiety.  Trouble getting pregnant. How is this diagnosed? This condition is diagnosed based on your medical history, a physical exam, your age, your menstrual history, and your symptoms. Hormone tests may also be done. How is this  treated? In some cases, no treatment is needed. You and your health care provider should make a decision together about whether treatment is necessary. Treatment will be based on your individual condition and preferences. Various treatments are available, such as:  Menopausal hormone therapy (MHT).  Medicines to treat specific  symptoms.  Acupuncture.  Vitamin or herbal supplements. Before starting treatment, make sure to let your health care provider know if you have a personal or family history of:  Heart disease.  Breast cancer.  Blood clots.  Diabetes.  Osteoporosis. Follow these instructions at home: Lifestyle  Do not use any products that contain nicotine or tobacco, such as cigarettes and e-cigarettes. If you need help quitting, ask your health care provider.  Eat a balanced diet that includes fresh fruits and vegetables, whole grains, soybeans, eggs, lean meat, and low-fat dairy.  Get at least 30 minutes of physical activity on 5 or more days each week.  Avoid alcoholic and caffeinated beverages, as well as spicy foods. This may help prevent hot flashes.  Get 7-8 hours of sleep each night.  Dress in layers that can be removed to help you manage hot flashes.  Find ways to manage stress, such as deep breathing, meditation, or journaling. General instructions  Keep track of your menstrual periods, including: ? When they occur. ? How heavy they are and how long they last. ? How much time passes between periods.  Keep track of your symptoms, noting when they start, how often you have them, and how long they last.  Take over-the-counter and prescription medicines only as told by your health care provider.  Take vitamin supplements only as told by your health care provider. These may include calcium, vitamin E, and vitamin D.  Use vaginal lubricants or moisturizers to help with vaginal dryness and improve comfort during sex.  Talk with your health care provider before starting any herbal supplements.  Keep all follow-up visits as told by your health care provider. This is important. This includes any group therapy or counseling. Contact a health care provider if:  You have heavy vaginal bleeding or pass blood clots.  Your period lasts more than 2 days longer than normal.  Your  periods are recurring sooner than 21 days.  You bleed after having sex. Get help right away if:  You have chest pain, trouble breathing, or trouble talking.  You have severe depression.  You have pain when you urinate.  You have severe headaches.  You have vision problems. Summary  Perimenopause is the time when a woman's body begins to move into menopause. This may happen naturally or as a result of other health problems or medical treatments.  Perimenopause can begin 2-8 years before menopause, and it usually lasts for 1 year after menopause.  Perimenopausal symptoms can be managed through medicines, lifestyle changes, and complementary therapies such as acupuncture. This information is not intended to replace advice given to you by your health care provider. Make sure you discuss any questions you have with your health care provider. Document Revised: 02/04/2017 Document Reviewed: 03/30/2016 Elsevier Patient Education  2020 [employer].

## 2019-09-11 ENCOUNTER — Other Ambulatory Visit: Payer: Self-pay | Admitting: Family Medicine

## 2019-09-11 DIAGNOSIS — M797 Fibromyalgia: Secondary | ICD-10-CM

## 2019-10-02 DIAGNOSIS — M2550 Pain in unspecified joint: Secondary | ICD-10-CM | POA: Diagnosis not present

## 2019-10-02 DIAGNOSIS — M797 Fibromyalgia: Secondary | ICD-10-CM | POA: Diagnosis not present

## 2019-10-02 DIAGNOSIS — R21 Rash and other nonspecific skin eruption: Secondary | ICD-10-CM | POA: Diagnosis not present

## 2019-10-02 DIAGNOSIS — M79641 Pain in right hand: Secondary | ICD-10-CM | POA: Diagnosis not present

## 2019-10-02 DIAGNOSIS — M79642 Pain in left hand: Secondary | ICD-10-CM | POA: Diagnosis not present

## 2019-10-02 DIAGNOSIS — M359 Systemic involvement of connective tissue, unspecified: Secondary | ICD-10-CM | POA: Diagnosis not present

## 2019-10-09 DIAGNOSIS — R61 Generalized hyperhidrosis: Secondary | ICD-10-CM | POA: Diagnosis not present

## 2019-10-18 ENCOUNTER — Other Ambulatory Visit: Payer: Self-pay | Admitting: Family Medicine

## 2019-10-18 DIAGNOSIS — I10 Essential (primary) hypertension: Secondary | ICD-10-CM

## 2019-10-18 DIAGNOSIS — E7800 Pure hypercholesterolemia, unspecified: Secondary | ICD-10-CM

## 2019-10-25 NOTE — Progress Notes (Deleted)
Name: Savannah Richards   MRN: [id number]    DOB: 01-31-1976   Date:10/25/2019       Progress Note  Subjective  Chief Complaint  No chief complaint on file.   HPI  History of COVID-19: she was diagnosed with COVID-19 on 03/19/2019, she states she had a fever for one day, SOB for a couple of days and very mild, sinus congestion, body aches and lost sense of taste and smell, symptoms resolved after 4-5 days except for lack of taste and smell that lasted longer , still has occasional mental fogginess but doing better, mild sob but also improving, palpitation is still present   Palpitation and decrease in exercise tolerance: also started post-COVID, started about one week after onset - diagnosed first week of January. She has been evaluated by cardiologist, Dr. Allena Katz in April and holter monitor negative. She states palpitation is still frequent, but not daily, usually more frequent at work. She states sob is gradually improving.   Prediabetes:last A1C was done07/2020 was5.7%denies polyphagia, polyuria or polydipsia.Following a diabetic diet   HTN: had multiple tests done, including for rule out of hyperaldosteronism or pheochromocytoma.BP is at goal todaywith current regiment, she denies orthostatic changes. No chest pain recently but has palpitation   Hyperlipidemia: last LDL was207, not on medication, positive family history of heart disease, maternal grandmother had a stroke in her 50's, maternal uncles MI's in their 65's. Mother has HTN and hyperlipidemia.She is only on life style modifcation and Crestor, she denies myalgia from medication. LDL was still high last time at 173.   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 She states currently doing well, takes  Bentyl very seldom, Bristol scale of 4, no abdominal cramping at this time, and not missing work. She states having children, hysterectomy and cymbalta seems to have helped with symptoms.She needs a refill of Bentyl   GAD: doing well on higher dose of Cymbalta, since last visit she has seen Dr. Allena Katz ( cardiologist ) and holter monitor showed sinus rhythm. She states Buspar made her feel more moody. Hydroxyzine makes her sleepy and disconnected. She has tried Zoloft but it caused hair loss, lexapro made her emotionless. She states work has been very stressful. She works at [employer], but since they started giving vaccine , one of the pharmacist was pulled from the floor and she has to do her job and also help others out. She is feeling overwhelmed. She does not feel supported at work at this time. She is able to be calm at home, no losing her patience with her kids. We discussed 10 minutes of self care daily and we will increase gabapentin to 100 mg qid   Arthralgia and FMS: positive ANA, she initially sawseen by Dr. Renard Matter but given reassurance and advised tylenol and ibuprofen prn,we rechecked labs back in 07/2017 and sed rate was elevated at 38 but normal CRP at 2.0Symptoms got worse over the Spring 2020 , she decided to go to [facility] appointment was 10/2018 and was given 5 weeks of prednisone taper and Gabapentin 100 mg. She states medications helped her significantly, joints on her fingers finally went down in size and pain was controlled, but off prednisone for the past week and her body is aching again, burning  and fingers are swelling up again,she saw Dr. Aundria Rud end of 2020 and was started on Plaquenil she is feeling much better since, pain right now is  3/10, but improves as the day progresses. No swelling or increase in warmth of joints  Eczema: seen by dermatologist and using Halog , it has helped    Patient Active Problem List   Diagnosis Date Noted  . Connective tissue  disease (HCC) 07/20/2019  . Fibromyalgia 07/20/2019  . GAD (generalized anxiety disorder) 10/19/2017  . Irritable bowel syndrome with diarrhea 07/19/2017  . Positive ANA (antinuclear antibody) 05/16/2017  . History of total vaginal hysterectomy (TVH) 05/11/2017  . History of back surgery 08/19/2016  . Aortic valve sclerosis 08/03/2016  . Gastroesophageal reflux disease 07/23/2016  . Essential hypertension, benign 07/08/2015  . Vitamin D insufficiency 05/28/2015  . Cardiac murmur 02/18/2015  . Prediabetes 12/11/2014  . Hyperlipidemia 12/11/2014  . Anxiety 10/30/2014    Past Surgical History:  Procedure Laterality Date  . CESAREAN SECTION     x 2  . HERNIA REPAIR     umbilicus  . MICRODISCECTOMY LUMBAR  08/19/2016   L4-L5  . MOLE REMOVAL     multiple  . VAGINAL HYSTERECTOMY  10/2011    Family History  Problem Relation Age of Onset  . Osteoporosis Mother   . Endometriosis Mother   . Hypertension Mother   . Diabetes Father   . Heart disease Father   . Hypertension Father   . Colon polyps Father   . Thyroid disease Sister   . Hypertension Brother   . Hyperlipidemia Brother   . Diabetes Maternal Grandfather   . Colon cancer Paternal Grandmother   . Diabetes Paternal Grandfather   . Stroke Maternal Grandmother   . Breast cancer Neg Hx   . Ovarian cancer Neg Hx     Social History   Tobacco Use  . Smoking status: Never Smoker  . Smokeless tobacco: Never Used  Substance Use Topics  . Alcohol use: No    Alcohol/week: 0.0 standard drinks     Current Outpatient Medications:  .  b complex vitamins capsule, Take by mouth., Disp: , Rfl:  .  cetirizine (ZYRTEC) 10 MG tablet, Take by mouth as needed. , Disp: , Rfl:  .  Cholecalciferol (VITAMIN D) 50 MCG (2000 UT) CAPS, Take 1 capsule (2,000 Units total) by mouth daily., Disp: 100 capsule, Rfl: 1 .  dicyclomine (BENTYL) 10 MG capsule, Take 1 capsule (10 mg total) by mouth 3 (three) times daily as needed., Disp: 90  capsule, Rfl: 0 .  DULoxetine (CYMBALTA) 60 MG capsule, Take 1 capsule (60 mg total) by mouth daily., Disp: 90 capsule, Rfl: 0 .  gabapentin (NEURONTIN) 100 MG capsule, TAKE (1) CAPSULE BY MOUTH FOUR TIMES DAILY, Disp: 120 capsule, Rfl: 0 .  Halcinonide (HALOG) 0.1 % CREA, , Disp: , Rfl:  .  hydroxychloroquine (PLAQUENIL) 200 MG tablet, Take 2 tablets by mouth daily., Disp: , Rfl:  .  hydrOXYzine (ATARAX/VISTARIL) 10 MG tablet, TAKE (1) OR (2) TABLETS BY MOUTH THREE TIMES DAILY AS NEEDED., Disp: 30 tablet, Rfl: 0 .  losartan (COZAAR) 100 MG tablet, TAKE 1 TABLET BY MOUTH ONCE DAILY., Disp: 30 tablet, Rfl: 0 .  rosuvastatin (CRESTOR) 20 MG tablet, TAKE 1 TABLET BY MOUTH ONCE DAILY., Disp: 30 tablet, Rfl: 0 .  triamcinolone cream (KENALOG) 0.1 %, Apply 1 application topically 2 (two) times daily., Disp: 453.6 g, Rfl: 0  Allergies  Allergen Reactions  . Ciprofloxacin  Hcl Other (See Comments)  . Shellfish Allergy   . Sulfamethoxazole-Trimethoprim Other (See Comments)    I personally reviewed {Reviewed:14835} with the patient/caregiver today.   ROS  ***  Objective  There were no vitals filed for this visit.  There is no height or weight on file to calculate BMI.  Physical Exam ***  No results found for this or any previous visit (from the past 2160 hour(s)).  Diabetic Foot Exam: Diabetic Foot Exam - Simple   No data filed     ***  PHQ2/9: Depression screen [facility] 2/9 07/20/2019 04/20/2019 11/28/2018 10/04/2018 06/06/2018  Decreased Interest 2 0 0 0 0  Down, Depressed, Hopeless 2 0 0 0 0  PHQ - 2 Score 4 0 0 0 0  Altered sleeping 0 0 0 0 0  Tired, decreased energy 2 0 1 0 0  Change in appetite 0 0 0 0 0  Feeling bad or failure about yourself  0 0 0 0 0  Trouble concentrating 1 0 0 0 0  Moving slowly or fidgety/restless 0 0 0 0 0  Suicidal thoughts 0 0 0 0 0  PHQ-9 Score 7 0 1 0 0  Difficult doing work/chores Somewhat difficult - Not difficult at all Not difficult at all -   Some recent data might be hidden    phq 9 is {gen pos [id number]} ***  Fall Risk: Fall Risk  07/20/2019 04/20/2019 11/28/2018 10/04/2018 06/06/2018  Falls in the past year? 0 0 0 0 0  Number falls in past yr: 0 0 0 0 0  Injury with Fall? 0 0 0 0 0  Follow up - - - - -   ***   Functional Status Survey:   ***   Assessment & Plan  *** There are no diagnoses linked to this encounter.   HPI, Exam and A&P transcribed by Cammy Copa under direction and in the presence of Mila Merry, MD

## 2019-10-26 ENCOUNTER — Ambulatory Visit: Payer: Federal, State, Local not specified - PPO | Admitting: Family Medicine

## 2019-10-26 DIAGNOSIS — Z1159 Encounter for screening for other viral diseases: Secondary | ICD-10-CM

## 2019-11-20 ENCOUNTER — Other Ambulatory Visit: Payer: Self-pay | Admitting: Family Medicine

## 2019-11-20 DIAGNOSIS — F411 Generalized anxiety disorder: Secondary | ICD-10-CM

## 2019-11-20 DIAGNOSIS — M797 Fibromyalgia: Secondary | ICD-10-CM

## 2019-12-21 ENCOUNTER — Other Ambulatory Visit: Payer: Self-pay | Admitting: Family Medicine

## 2019-12-21 DIAGNOSIS — E7800 Pure hypercholesterolemia, unspecified: Secondary | ICD-10-CM

## 2019-12-21 DIAGNOSIS — I10 Essential (primary) hypertension: Secondary | ICD-10-CM

## 2019-12-21 DIAGNOSIS — M797 Fibromyalgia: Secondary | ICD-10-CM

## 2019-12-21 NOTE — Telephone Encounter (Signed)
Requested medication (s) are due for refill today: yes  Requested medication (s) are on the active medication list: {yes  Last refill: both fills 10/18/19  #30 0 refills  Future visit scheduled:no  Notes to clinic:  Labs are dated 09/2018    Requested Prescriptions  Pending Prescriptions Disp Refills   losartan (COZAAR) 100 MG tablet [Pharmacy Med Name: LOSARTAN POTASSIUM 100 MG TAB] 30 tablet 0    Sig: TAKE 1 TABLET BY MOUTH ONCE DAILY.      Cardiovascular:  Angiotensin Receptor Blockers Failed - 12/21/2019  2:00 PM      Failed - Cr in normal range and within 180 days    Creat  Date Value Ref Range Status  10/04/2018 0.60 0.50 - 1.10 mg/dL Final          Failed - K in normal range and within 180 days    Potassium  Date Value Ref Range Status  10/04/2018 4.6 3.5 - 5.3 mmol/L Final  09/13/2011 4.4 3.5 - 5.1 mmol/L Final          Passed - Patient is not pregnant      Passed - Last BP in normal range    BP Readings from Last 1 Encounters:  08/21/19 134/88          Passed - Valid encounter within last 6 months    Recent Outpatient Visits           5 months ago GAD (generalized anxiety disorder)   [facility] Cory, MD   8 months ago Palpitation   [facility], MD   1 year ago Positive ANA (antinuclear antibody)   [facility] Cory, MD   1 year ago Tinea corporis   [facility] Cheryle Horsfall, NP   1 year ago Essential hypertension, benign   [facility] [place], Danna Hefty, MD                rosuvastatin (CRESTOR) 20 MG tablet [Pharmacy Med Name: ROSUVASTATIN CALCIUM 20 MG TAB] 30 tablet 0    Sig: TAKE 1 TABLET BY MOUTH ONCE DAILY.      Cardiovascular:  Antilipid - Statins Failed - 12/21/2019  2:00 PM      Failed - Total Cholesterol in normal range and within 360 days    Cholesterol, Total  Date Value Ref Range Status   05/28/2015 209 (H) 100 - 199 mg/dL Final   Cholesterol  Date Value Ref Range Status  11/28/2018 251 (H) <200 mg/dL Final          Failed - LDL in normal range and within 360 days    LDL Cholesterol (Calc)  Date Value Ref Range Status  11/28/2018 173 (H) mg/dL (calc) Final    Comment:    Reference range: <100 . Desirable range <100 mg/dL for primary prevention;   <70 mg/dL for patients with CHD or diabetic patients  with > or = 2 CHD risk factors. Marland Kitchen LDL-C is now calculated using the Martin-Hopkins  calculation, which is a validated novel method providing  better accuracy than the Friedewald equation in the  estimation of LDL-C.  Horald Pollen et al. Lenox Ahr. 0865;784(69): 2061-2068  (http://education.[facility].com/faq/FAQ164)           Failed - HDL in normal range and within 360 days    HDL  Date Value Ref Range Status  11/28/2018 58 > OR = 50 mg/dL Final  62/95/2841 52 >  39 mg/dL Final          Failed - Triglycerides in normal range and within 360 days    Triglycerides  Date Value Ref Range Status  11/28/2018 89 <150 mg/dL Final          Passed - Patient is not pregnant      Passed - Valid encounter within last 12 months    Recent Outpatient Visits           5 months ago GAD (generalized anxiety disorder)   [facility], MD   8 months ago Palpitation   [facility], MD   1 year ago Positive ANA (antinuclear antibody)   [facility], MD   1 year ago Tinea corporis   [facility] Cheryle Horsfall, NP   1 year ago Essential hypertension, benign   [facility] [place], Danna Hefty, MD               Signed Prescriptions Disp Refills   gabapentin (NEURONTIN) 100 MG capsule 360 capsule 2    Sig: TAKE (1) CAPSULE BY MOUTH FOUR TIMES DAILY      Neurology: Anticonvulsants - gabapentin Passed - 12/21/2019  2:00 PM       Passed - Valid encounter within last 12 months    Recent Outpatient Visits           5 months ago GAD (generalized anxiety disorder)   [facility], MD   8 months ago Palpitation   [facility], MD   1 year ago Positive ANA (antinuclear antibody)   [facility], MD   1 year ago Tinea corporis   [facility] Horsfall, NP   1 year ago Essential hypertension, benign   [facility] Al[facility], MD

## 2019-12-21 NOTE — Telephone Encounter (Signed)
Requested Prescriptions  Pending Prescriptions Disp Refills   losartan (COZAAR) 100 MG tablet [Pharmacy Med Name: LOSARTAN POTASSIUM 100 MG TAB] 30 tablet 0    Sig: TAKE 1 TABLET BY MOUTH ONCE DAILY.     Cardiovascular:  Angiotensin Receptor Blockers Failed - 12/21/2019  2:00 PM      Failed - Cr in normal range and within 180 days    Creat  Date Value Ref Range Status  10/04/2018 0.60 0.50 - 1.10 mg/dL Final         Failed - K in normal range and within 180 days    Potassium  Date Value Ref Range Status  10/04/2018 4.6 3.5 - 5.3 mmol/L Final  09/13/2011 4.4 3.5 - 5.1 mmol/L Final         Passed - Patient is not pregnant      Passed - Last BP in normal range    BP Readings from Last 1 Encounters:  08/21/19 134/88         Passed - Valid encounter within last 6 months    Recent Outpatient Visits          5 months ago GAD (generalized anxiety disorder)   [facility], MD   8 months ago Palpitation   [facility], [facility]   1 year ago Positive ANA (antinuclear antibody)   [facility], MD   1 year ago Tinea corporis   [facility] Cher[facility] Horsfall, NP   1 year ago Essential hypertension, benign   [facility] [place], Danna Hefty, MD              rosuvastatin (CRESTOR) 20 MG tablet [Pharmacy Med Name: ROSUVASTATIN CALCIUM 20 MG TAB] 30 tablet 0    Sig: TAKE 1 TABLET BY MOUTH ONCE DAILY.     Cardiovascular:  Antilipid - Statins Failed - 12/21/2019  2:00 PM      Failed - Total Cholesterol in normal range and within 360 days    Cholesterol, Total  Date Value Ref Range Status  05/28/2015 209 (H) 100 - 199 mg/dL Final   Cholesterol  Date Value Ref Range Status  11/28/2018 251 (H) <200 mg/dL Final         Failed - LDL in normal range and within 360 days    LDL Cholesterol (Calc)  Date Value Ref Range Status  11/28/2018 173 (H)  mg/dL (calc) Final    Comment:    Reference range: <100 . Desirable range <100 mg/dL for primary prevention;   <70 mg/dL for patients with CHD or diabetic patients  with > or = 2 CHD risk factors. Marland Kitchen LDL-C is now calculated using the Martin-Hopkins  calculation, which is a validated novel method providing  better accuracy than the Friedewald equation in the  estimation of LDL-C.  Horald Pollen et al. Lenox Ahr. 4008;676(19): 2061-2068  (http://education.[facility].com/faq/FAQ164)          Failed - HDL in normal range and within 360 days    HDL  Date Value Ref Range Status  11/28/2018 58 > OR = 50 mg/dL Final  50/93/2671 52 >24 mg/dL Final         Failed - Triglycerides in normal range and within 360 days    Triglycerides  Date Value Ref Range Status  11/28/2018 89 <150 mg/dL Final         Passed - Patient is not pregnant  Passed - Valid encounter within last 12 months    Recent Outpatient Visits          5 months ago GAD (generalized anxiety disorder)   [facility], MD   8 months ago Palpitation   [facility] Al[facility] Cory, MD   1 year ago Positive ANA (antinuclear antibody)   [facility], MD   1 year ago Tinea corporis   [facility] Cher[facility] Horsfall, NP   1 year ago Essential hypertension, benign   [facility], Danna Hefty, MD              gabapentin (NEURONTIN) 100 MG capsule [Pharmacy Med Name: GABAPENTIN 100 MG CAPSULE] 360 capsule 2    Sig: TAKE (1) CAPSULE BY MOUTH FOUR TIMES DAILY     Neurology: Anticonvulsants - gabapentin Passed - 12/21/2019  2:00 PM      Passed - Valid encounter within last 12 months    Recent Outpatient Visits          5 months ago GAD (generalized anxiety disorder)   [facility], MD   8 months ago Palpitation   [facility], MD   1 year ago Positive ANA (antinuclear antibody)   [facility], [facility]   1 year ago Tinea corporis   [facility] Cheryle Horsfall, NP   1 year ago Essential hypertension, benign   [facility] Al[facility] Cory, MD

## 2019-12-24 ENCOUNTER — Other Ambulatory Visit: Payer: Self-pay

## 2020-01-30 ENCOUNTER — Other Ambulatory Visit: Payer: Self-pay | Admitting: Family Medicine

## 2020-01-30 DIAGNOSIS — M797 Fibromyalgia: Secondary | ICD-10-CM

## 2020-01-30 DIAGNOSIS — F411 Generalized anxiety disorder: Secondary | ICD-10-CM

## 2020-01-30 NOTE — Telephone Encounter (Signed)
Requested medication (s) are due for refill today:   Yes  Requested medication (s) are on the active medication list:   Yes  Future visit scheduled:   No   Last ordered: 11/20/2019 #60 0 refills by [employer]  Returned:   Looks like she has had a 2 month courtesy given.   I left a message for her to call back for an appt.  Provider to decide on another refill.   Requested Prescriptions  Pending Prescriptions Disp Refills   DULoxetine (CYMBALTA) 60 MG capsule [Pharmacy Med Name: DULOXETINE HCL DR 60 MG CAP] 30 capsule 0    Sig: TAKE (1) CAPSULE BY MOUTH ONCE DAILY.      Psychiatry: Antidepressants - SNRI Failed - 01/30/2020  1:09 PM      Failed - Valid encounter within last 6 months    Recent Outpatient Visits           6 months ago GAD (generalized anxiety disorder)   [facility], MD   9 months ago Palpitation   [facility], MD   1 year ago Positive ANA (antinuclear antibody)   [facility], MD   1 year ago Tinea corporis   [facility] Cheryle Horsfall, NP   1 year ago Essential hypertension, benign   [facility] [place], Danna Hefty, MD              Passed - Last BP in normal range    BP Readings from Last 1 Encounters:  08/21/19 134/88

## 2020-01-30 NOTE — Telephone Encounter (Signed)
Called the patient at 4:02 on 01-30-2020 and lvm that per Dr Carlynn Purl. Patient needs to be seen on 11-29 or 11-30 at 1pm per [place]. Pt will have to be transferred to office in order to schedule this appt. Dr Carlynn Purl called in her RX for only 7 days worth till appt.

## 2020-03-20 ENCOUNTER — Ambulatory Visit: Payer: Federal, State, Local not specified - PPO | Admitting: Family Medicine

## 2020-03-31 NOTE — Progress Notes (Addendum)
Name: Savannah Richards   MRN: [id number]    DOB: 06-06-1975   Date:04/01/2020       Progress Note  Subjective  Chief Complaint  Medication Refill  HPI  History of COVID-19: she was diagnosed with COVID-19 on 03/19/2019, she states she had a fever for one day, SOB for a couple of days and very mild, sinus congestion, body aches and lost sense of taste and smell, symptoms resolved after 4-5 days except for lack of taste and smell that lasted longer , still has occasional mental fogginess but doing better, mild sob, she continues to improved, but still has palpation.    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 Marland Kitchen She has been evaluated by cardiologist, Dr. Allena Katz in April and holter monitor negative. She states palpitation is still frequent, but not daily, usually more frequent at work. She states sob is gradually improving.   Prediabetes:last A1C was done 09/2018 was  5.7% but repeat in 2021 was normal. She denies polyphagia, polyuria or polydipsia.Not following a healthy diet lately   HTN: had multiple tests done, including for rule out of hyperaldosteronism or pheochromocytoma.BP is towards high end of normal today, but she did not take losartan this morning, continue current regiment, only takes inderal prn palpitation   Hyperlipidemia: last LDL was207, not on medication, positive family history of heart disease, maternal grandmother had a stroke in her 62's, maternal uncles MI's in their 59's. Mother has HTN and hyperlipidemia.She is only on life style modifcation and Crestor, she denies myalgia from medication. 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 She states currently doing well, takes Bentyl  very seldom, Bristol scale of 4, no abdominal cramping at this time, and not missing work. She states having children, hysterectomy and cymbalta seems to have helped with symptoms.She has not been taking Bentyl lately   GAD: she is on 90 mg Cymbalta , since last visit she has seen Dr. Allena Katz ( cardiologist ) and holter monitor showed sinus rhythm. She states Buspar made her feel more moody. Hydroxyzine makes her sleepy and disconnected. She has tried Zoloft but it caused hair loss, lexapro made her emotionless. She states work has been very stressful. She works at [employer] and they have been super busy during the pandemic. Marland Kitchen She is doing better at work lately  She is able to be calm at home, no losing her patience with her kids.   Arthralgia and FMS: positive ANA, she initially sawseen by Dr. Renard Matter but given reassurance and advised tylenol and ibuprofen prn,we rechecked labs back in 07/2017 and sed rate was elevated at 38 but normal CRP at 2.0Symptoms got worse over the Spring 2020 , she decided to go to [facility] appointment was 10/2018 and was given 5 weeks of prednisone taper and Gabapentin 100 mg - now taking it 4 times daily . She states medications helped her significantly, joints on her fingers finally went down in size and pain was controlled, She saw Dr. Aundria Rud end of 2020 and was placed on Plaquenil but she stopped taking it about 3 months ago because she was feeling tired with medication, she has contacted her but no response yet, we will recheck labs  today. Pain today from FMS is zero, joints also good at a zero  Eczema: seen by dermatologist and using Halog , it helped temporarily, she is now on Aveno lotion and soap bar , but now also not working. It is constantly itchy and burns when she washes her hands. Advised to follow up with Dermatologist to discuss immunomodulator's.   Patient Active Problem List   Diagnosis Date Noted  . Eczema of both hands 04/01/2020  . COVID-19  long hauler manifesting chronic palpitations 04/01/2020  . Connective tissue disease (HCC) 07/20/2019  . Fibromyalgia 07/20/2019  . GAD (generalized anxiety disorder) 10/19/2017  . Irritable bowel syndrome with diarrhea 07/19/2017  . Positive ANA (antinuclear antibody) 05/16/2017  . History of total vaginal hysterectomy (TVH) 05/11/2017  . History of back surgery 08/19/2016  . Aortic valve sclerosis 08/03/2016  . Gastroesophageal reflux disease 07/23/2016  . Essential hypertension, benign 07/08/2015  . Vitamin D insufficiency 05/28/2015  . Cardiac murmur 02/18/2015  . Prediabetes 12/11/2014  . Hyperlipidemia 12/11/2014  . Anxiety 10/30/2014    Past Surgical History:  Procedure Laterality Date  . CESAREAN SECTION     x 2  . HERNIA REPAIR     umbilicus  . MICRODISCECTOMY LUMBAR  08/19/2016   L4-L5  . MOLE REMOVAL     multiple  . VAGINAL HYSTERECTOMY  10/2011    Family History  Problem Relation Age of Onset  . Osteoporosis Mother   . Endometriosis Mother   . Hypertension Mother   . Diabetes Father   . Heart disease Father   . Hypertension Father   . Colon polyps Father   . Thyroid disease Sister   . Hypertension Brother   . Hyperlipidemia Brother   . Diabetes Maternal Grandfather   . Colon cancer Paternal Grandmother   . Diabetes Paternal Grandfather   . Stroke Maternal Grandmother   . Breast cancer Neg Hx   . Ovarian cancer Neg Hx     Social History   Tobacco Use  . Smoking status: Never Smoker  . Smokeless tobacco: Never Used  Substance Use Topics  . Alcohol use: No    Alcohol/week: 0.0 standard drinks     Current Outpatient Medications:  .  b complex vitamins capsule, Take by mouth., Disp: , Rfl:  .  cetirizine (ZYRTEC) 10 MG tablet, Take by mouth as needed. , Disp: , Rfl:  .  dicyclomine (BENTYL) 10 MG capsule, Take 1 capsule (10 mg total) by mouth 3 (three) times daily as needed., Disp: 90 capsule, Rfl: 0 .  DULoxetine (CYMBALTA) 30 MG capsule, Take  1 capsule (30 mg total) by mouth daily., Disp: 90 capsule, Rfl: 1 .  hydroxychloroquine (PLAQUENIL) 200 MG tablet, Take 2 tablets by mouth daily., Disp: , Rfl:  .  propranolol (INDERAL) 10 MG tablet, Take 10 mg by mouth 2 (two) times daily., Disp: , Rfl:  .  Cholecalciferol (VITAMIN D) 50 MCG (2000 UT) CAPS, Take 1 capsule (2,000 Units total) by mouth daily., Disp: 100 capsule, Rfl: 1 .  DULoxetine (CYMBALTA) 60 MG capsule, Take 1 capsule (60 mg total) by mouth daily., Disp: 90 capsule, Rfl: 1 .  gabapentin (NEURONTIN) 100 MG capsule, Take 1 capsule (100 mg total) by mouth 4 (four) times daily., Disp: 360 capsule, Rfl: 1 .  Halcinonide 0.1 % CREA, , Disp: , Rfl:  .  hydrOXYzine (ATARAX/VISTARIL) 10 MG tablet, TAKE (1) OR (2) TABLETS BY MOUTH THREE TIMES DAILY AS NEEDED. (Patient not taking: Reported on 04/01/2020), Disp:  30 tablet, Rfl: 0 .  losartan (COZAAR) 100 MG tablet, Take 1 tablet (100 mg total) by mouth daily., Disp: 90 tablet, Rfl: 1 .  meloxicam (MOBIC) 15 MG tablet, Take 1 tablet (15 mg total) by mouth daily., Disp: 90 tablet, Rfl: 1 .  rosuvastatin (CRESTOR) 20 MG tablet, Take 1 tablet (20 mg total) by mouth daily., Disp: 90 tablet, Rfl: 1 .  triamcinolone cream (KENALOG) 0.1 %, Apply 1 application topically 2 (two) times daily. (Patient not taking: Reported on 04/01/2020), Disp: 453.6 g, Rfl: 0  Allergies  Allergen Reactions  . Ciprofloxacin Hcl Other (See Comments)  . Shellfish Allergy   . Sulfamethoxazole-Trimethoprim Other (See Comments)    I personally reviewed active problem list, medication list, allergies, family history, social history, health maintenance with the patient/caregiver today.   ROS  Constitutional: Negative for fever, positive for  weight change.  Respiratory: Negative for cough and shortness of breath.   Cardiovascular: Negative for chest pain , positive for  palpitations.  Gastrointestinal: Negative for abdominal pain, no bowel changes.  Musculoskeletal:  Negative for gait problem or joint swelling.  Skin:postiive  for rash.   Neurological: Negative for dizziness or headache.  No other specific complaints in a complete review of systems (except as listed in HPI above).  Objective  Vitals:   04/01/20 0936  BP: 136/82  Pulse: 83  Resp: 16  Temp: 98.1 F (36.7 C)  TempSrc: Oral  SpO2: 99%  Weight: 162 lb 14.4 oz (73.9 kg)  Height: 5\' 4"  (1.626 m)    Body mass index is 27.96 kg/m.  Physical Exam  Constitutional: Patient appears well-developed and well-nourished. Obese  No distress.  HEENT: head atraumatic, normocephalic, pupils equal and reactive to light,  neck supple Cardiovascular: Normal rate, regular rhythm and normal heart sounds.  No murmur heard. No BLE edema. Pulmonary/Chest: Effort normal and breath sounds normal. No respiratory distress. Abdominal: Soft.  There is no tenderness. Skin: eczema on both hands worse on the right side Psychiatric: Patient has a normal mood and affect. behavior is normal. Judgment and thought content normal.  PHQ2/9: Depression screen [facility] 2/9 04/01/2020 07/20/2019 04/20/2019 11/28/2018 10/04/2018  Decreased Interest 0 2 0 0 0  Down, Depressed, Hopeless 0 2 0 0 0  PHQ - 2 Score 0 4 0 0 0  Altered sleeping 0 0 0 0 0  Tired, decreased energy 2 2 0 1 0  Change in appetite 0 0 0 0 0  Feeling bad or failure about yourself  0 0 0 0 0  Trouble concentrating 0 1 0 0 0  Moving slowly or fidgety/restless 0 0 0 0 0  Suicidal thoughts 0 0 0 0 0  PHQ-9 Score 2 7 0 1 0  Difficult doing work/chores - Somewhat difficult - Not difficult at all Not difficult at all  Some recent data might be hidden    phq 9 is negative   GAD 7 : Generalized Anxiety Score 04/01/2020 07/20/2019 06/06/2018 10/19/2017  Nervous, Anxious, on Edge 1 2 1  0  Control/stop worrying 0 0 0 0  Worry too much - different things 0 0 0 0  Trouble relaxing 0 1 0 0  Restless 0 0 0 0  Easily annoyed or irritable 1 2 1  0  Afraid - awful  might happen 0 0 0 0  Total GAD 7 Score 2 5 2  0  Anxiety Difficulty - - Not difficult at all Not difficult at all     Fall  Risk: Fall Risk  04/01/2020 07/20/2019 04/20/2019 11/28/2018 10/04/2018  Falls in the past year? 0 0 0 0 0  Number falls in past yr: 0 0 0 0 0  Injury with Fall? 0 0 0 0 0  Follow up - - - - -     Functional Status Survey: Is the patient deaf or have difficulty hearing?: No Does the patient have difficulty seeing, even when wearing glasses/contacts?: No Does the patient have difficulty concentrating, remembering, or making decisions?: No Does the patient have difficulty walking or climbing stairs?: No Does the patient have difficulty dressing or bathing?: No Does the patient have difficulty doing errands alone such as visiting a doctor's office or shopping?: No    Assessment & Plan  1. COVID-19 long hauler manifesting chronic palpitations  Improving gradually   2. GAD (generalized anxiety disorder)  - DULoxetine (CYMBALTA) 60 MG capsule; Take 1 capsule (60 mg total) by mouth daily.  Dispense: 90 capsule; Refill: 1 - DULoxetine (CYMBALTA) 30 MG capsule; Take 1 capsule (30 mg total) by mouth daily.  Dispense: 90 capsule; Refill: 1  3. Fibromyalgia  - DULoxetine (CYMBALTA) 60 MG capsule; Take 1 capsule (60 mg total) by mouth daily.  Dispense: 90 capsule; Refill: 1 - gabapentin (NEURONTIN) 100 MG capsule; Take 1 capsule (100 mg total) by mouth 4 (four) times daily.  Dispense: 360 capsule; Refill: 1 - DULoxetine (CYMBALTA) 30 MG capsule; Take 1 capsule (30 mg total) by mouth daily.  Dispense: 90 capsule; Refill: 1  4. Essential hypertension, benign  - losartan (COZAAR) 100 MG tablet; Take 1 tablet (100 mg total) by mouth daily.  Dispense: 90 tablet; Refill: 1  5. Pure hypercholesterolemia  - rosuvastatin (CRESTOR) 20 MG tablet; Take 1 tablet (20 mg total) by mouth daily.  Dispense: 90 tablet; Refill: 1 - Lipid panel  6. Vitamin D deficiency  -  Cholecalciferol (VITAMIN D) 50 MCG (2000 UT) CAPS; Take 1 capsule (2,000 Units total) by mouth daily.  Dispense: 100 capsule; Refill: 1 - VITAMIN D 25 Hydroxy (Vit-D Deficiency, Fractures)  7. Connective tissue disease (HCC)  - COMPLETE METABOLIC PANEL WITH GFR - CBC with Differential/Platelet - Sedimentation rate - C-reactive protein  8. Irritable bowel syndrome with diarrhea   9. Eczema of both hands  Go back to dermatologist   10. Weight gain  - TSH  11. Pre-diabetes  - Hemoglobin A1c  12. Need for hepatitis C screening test  - Hepatitis C antibody  13. Other fatigue  - Vitamin B12

## 2020-04-01 ENCOUNTER — Ambulatory Visit: Payer: Federal, State, Local not specified - PPO | Admitting: Family Medicine

## 2020-04-01 ENCOUNTER — Other Ambulatory Visit: Payer: Self-pay

## 2020-04-01 ENCOUNTER — Encounter: Payer: Self-pay | Admitting: Family Medicine

## 2020-04-01 VITALS — BP 136/82 | HR 83 | Temp 98.1°F | Resp 16 | Ht 64.0 in | Wt 162.9 lb

## 2020-04-01 DIAGNOSIS — Z1159 Encounter for screening for other viral diseases: Secondary | ICD-10-CM

## 2020-04-01 DIAGNOSIS — E7800 Pure hypercholesterolemia, unspecified: Secondary | ICD-10-CM

## 2020-04-01 DIAGNOSIS — M797 Fibromyalgia: Secondary | ICD-10-CM

## 2020-04-01 DIAGNOSIS — E559 Vitamin D deficiency, unspecified: Secondary | ICD-10-CM | POA: Diagnosis not present

## 2020-04-01 DIAGNOSIS — M359 Systemic involvement of connective tissue, unspecified: Secondary | ICD-10-CM | POA: Diagnosis not present

## 2020-04-01 DIAGNOSIS — R7303 Prediabetes: Secondary | ICD-10-CM

## 2020-04-01 DIAGNOSIS — R5383 Other fatigue: Secondary | ICD-10-CM

## 2020-04-01 DIAGNOSIS — U099 Post covid-19 condition, unspecified: Secondary | ICD-10-CM

## 2020-04-01 DIAGNOSIS — L309 Dermatitis, unspecified: Secondary | ICD-10-CM

## 2020-04-01 DIAGNOSIS — R002 Palpitations: Secondary | ICD-10-CM

## 2020-04-01 DIAGNOSIS — I10 Essential (primary) hypertension: Secondary | ICD-10-CM

## 2020-04-01 DIAGNOSIS — R635 Abnormal weight gain: Secondary | ICD-10-CM

## 2020-04-01 DIAGNOSIS — K580 Irritable bowel syndrome with diarrhea: Secondary | ICD-10-CM

## 2020-04-01 DIAGNOSIS — F411 Generalized anxiety disorder: Secondary | ICD-10-CM | POA: Diagnosis not present

## 2020-04-01 MED ORDER — DULOXETINE HCL 30 MG PO CPEP: 30.0000 mg | ORAL_CAPSULE | Freq: Every day | ORAL | 1 refills | Status: DC

## 2020-04-01 MED ORDER — GABAPENTIN 100 MG PO CAPS
100.0000 mg | ORAL_CAPSULE | Freq: Four times a day (QID) | ORAL | 1 refills | Status: DC
Start: 2020-04-01 — End: 2020-09-23

## 2020-04-01 MED ORDER — VITAMIN D 50 MCG (2000 UT) PO CAPS: 1.0000 | ORAL_CAPSULE | Freq: Every day | ORAL | 1 refills | Status: AC

## 2020-04-01 MED ORDER — DULOXETINE HCL 60 MG PO CPEP
60.0000 mg | ORAL_CAPSULE | Freq: Every day | ORAL | 1 refills | Status: DC
Start: 2020-04-01 — End: 2020-09-23

## 2020-04-01 MED ORDER — ROSUVASTATIN CALCIUM 20 MG PO TABS: 20.0000 mg | ORAL_TABLET | Freq: Every day | ORAL | 1 refills | Status: DC

## 2020-04-01 MED ORDER — MELOXICAM 15 MG PO TABS
15.0000 mg | ORAL_TABLET | Freq: Every day | ORAL | 1 refills | Status: DC
Start: 2020-04-01 — End: 2020-09-23

## 2020-04-01 MED ORDER — LOSARTAN POTASSIUM 100 MG PO TABS
100.0000 mg | ORAL_TABLET | Freq: Every day | ORAL | 1 refills | Status: DC
Start: 2020-04-01 — End: 2020-09-23

## 2020-04-02 LAB — CBC WITH DIFFERENTIAL/PLATELET
Absolute Monocytes: 655 cells/uL (ref 200–950)
Basophils Absolute: 22 cells/uL (ref 0–200)
Basophils Relative: 0.3 %
Eosinophils Absolute: 158 cells/uL (ref 15–500)
Eosinophils Relative: 2.2 %
HCT: 38.9 % (ref 35.0–45.0)
Hemoglobin: 13.1 g/dL (ref 11.7–15.5)
Lymphs Abs: 1642 cells/uL (ref 850–3900)
MCH: 31 pg (ref 27.0–33.0)
MCHC: 33.7 g/dL (ref 32.0–36.0)
MCV: 92 fL (ref 80.0–100.0)
MPV: 12.5 fL (ref 7.5–12.5)
Monocytes Relative: 9.1 %
Neutro Abs: 4723 cells/uL (ref 1500–7800)
Neutrophils Relative %: 65.6 %
Platelets: 226 10*3/uL (ref 140–400)
RBC: 4.23 10*6/uL (ref 3.80–5.10)
RDW: 11.4 % (ref 11.0–15.0)
Total Lymphocyte: 22.8 %
WBC: 7.2 10*3/uL (ref 3.8–10.8)

## 2020-04-02 LAB — LIPID PANEL
Cholesterol: 164 mg/dL (ref <200)
HDL: 62 mg/dL (ref >=50)
LDL Cholesterol (Calc): 84 mg/dL (calc)
Non-HDL Cholesterol (Calc): 102 mg/dL (calc) (ref <130)
Total CHOL/HDL Ratio: 2.6 (calc) (ref <5.0)
Triglycerides: 87 mg/dL (ref <150)

## 2020-04-02 LAB — VITAMIN B12: Vitamin B-12: 362 pg/mL (ref 200–1100)

## 2020-04-02 LAB — COMPLETE METABOLIC PANEL WITH GFR
AG Ratio: 1.3 (calc) (ref 1.0–2.5)
ALT: 16 U/L (ref 6–29)
AST: 21 U/L (ref 10–30)
Albumin: 4.4 g/dL (ref 3.6–5.1)
Alkaline phosphatase (APISO): 62 U/L (ref 31–125)
BUN: 19 mg/dL (ref 7–25)
CO2: 29 mmol/L (ref 20–32)
Calcium: 9.6 mg/dL (ref 8.6–10.2)
Chloride: 103 mmol/L (ref 98–110)
Creat: 0.58 mg/dL (ref 0.50–1.10)
GFR, Est African American: 132 mL/min/{1.73_m2} (ref >=60)
GFR, Est Non African American: 114 mL/min/{1.73_m2} (ref >=60)
Globulin: 3.3 g/dL (calc) (ref 1.9–3.7)
Glucose, Bld: 93 mg/dL (ref 65–99)
Potassium: 4.6 mmol/L (ref 3.5–5.3)
Sodium: 137 mmol/L (ref 135–146)
Total Bilirubin: 0.4 mg/dL (ref 0.2–1.2)
Total Protein: 7.7 g/dL (ref 6.1–8.1)

## 2020-04-02 LAB — TSH: TSH: 1.3 mIU/L

## 2020-04-02 LAB — HEPATITIS C ANTIBODY
Hepatitis C Ab: NONREACTIVE
SIGNAL TO CUT-OFF: 0.01 (ref <1.00)

## 2020-04-02 LAB — C-REACTIVE PROTEIN: CRP: 0.7 mg/L (ref <8.0)

## 2020-04-02 LAB — HEMOGLOBIN A1C
Hgb A1c MFr Bld: 5.7 % of total Hgb — ABNORMAL HIGH (ref <5.7)
Mean Plasma Glucose: 117 mg/dL
eAG (mmol/L): 6.5 mmol/L

## 2020-04-02 LAB — VITAMIN D 25 HYDROXY (VIT D DEFICIENCY, FRACTURES): Vit D, 25-Hydroxy: 49 ng/mL (ref 30–100)

## 2020-04-02 LAB — SEDIMENTATION RATE: Sed Rate: 17 mm/h (ref 0–20)

## 2020-04-03 ENCOUNTER — Encounter: Payer: Self-pay | Admitting: Family Medicine

## 2020-06-10 ENCOUNTER — Other Ambulatory Visit: Payer: Self-pay | Admitting: Family Medicine

## 2020-06-10 DIAGNOSIS — Z1231 Encounter for screening mammogram for malignant neoplasm of breast: Secondary | ICD-10-CM

## 2020-06-23 ENCOUNTER — Other Ambulatory Visit: Payer: Self-pay

## 2020-06-23 ENCOUNTER — Ambulatory Visit
Admission: RE | Admit: 2020-06-23 | Discharge: 2020-06-23 | Disposition: A | Discharge status: Home / Self Care | Payer: Federal, State, Local not specified - PPO | Source: Ambulatory Visit | Attending: Family Medicine | Admitting: Family Medicine

## 2020-06-23 DIAGNOSIS — Z1231 Encounter for screening mammogram for malignant neoplasm of breast: Secondary | ICD-10-CM

## 2020-06-24 ENCOUNTER — Other Ambulatory Visit: Payer: Self-pay | Admitting: Family Medicine

## 2020-06-24 DIAGNOSIS — R928 Other abnormal and inconclusive findings on diagnostic imaging of breast: Secondary | ICD-10-CM

## 2020-06-24 DIAGNOSIS — N6320 Unspecified lump in the left breast, unspecified quadrant: Secondary | ICD-10-CM

## 2020-06-25 DIAGNOSIS — L308 Other specified dermatitis: Secondary | ICD-10-CM | POA: Diagnosis not present

## 2020-06-25 DIAGNOSIS — B078 Other viral warts: Secondary | ICD-10-CM | POA: Diagnosis not present

## 2020-06-25 DIAGNOSIS — L578 Other skin changes due to chronic exposure to nonionizing radiation: Secondary | ICD-10-CM | POA: Diagnosis not present

## 2020-06-25 DIAGNOSIS — D225 Melanocytic nevi of trunk: Secondary | ICD-10-CM | POA: Diagnosis not present

## 2020-07-02 DIAGNOSIS — F418 Other specified anxiety disorders: Secondary | ICD-10-CM | POA: Diagnosis not present

## 2020-07-02 DIAGNOSIS — R5383 Other fatigue: Secondary | ICD-10-CM | POA: Diagnosis not present

## 2020-07-03 ENCOUNTER — Ambulatory Visit
Admission: RE | Admit: 2020-07-03 | Discharge: 2020-07-03 | Disposition: A | Discharge status: Home / Self Care | Payer: Federal, State, Local not specified - PPO | Source: Ambulatory Visit | Attending: Family Medicine | Admitting: Family Medicine

## 2020-07-03 ENCOUNTER — Other Ambulatory Visit: Payer: Self-pay

## 2020-07-03 ENCOUNTER — Other Ambulatory Visit: Payer: Self-pay | Admitting: Family Medicine

## 2020-07-03 DIAGNOSIS — R928 Other abnormal and inconclusive findings on diagnostic imaging of breast: Secondary | ICD-10-CM

## 2020-07-03 DIAGNOSIS — N6321 Unspecified lump in the left breast, upper outer quadrant: Secondary | ICD-10-CM | POA: Diagnosis not present

## 2020-07-03 DIAGNOSIS — N6320 Unspecified lump in the left breast, unspecified quadrant: Secondary | ICD-10-CM

## 2020-07-03 DIAGNOSIS — R922 Inconclusive mammogram: Secondary | ICD-10-CM | POA: Diagnosis not present

## 2020-07-09 ENCOUNTER — Ambulatory Visit
Admission: RE | Admit: 2020-07-09 | Discharge: 2020-07-09 | Disposition: A | Discharge status: Home / Self Care | Payer: Federal, State, Local not specified - PPO | Source: Ambulatory Visit | Attending: Family Medicine | Admitting: Family Medicine

## 2020-07-09 ENCOUNTER — Other Ambulatory Visit: Payer: Self-pay

## 2020-07-09 DIAGNOSIS — R928 Other abnormal and inconclusive findings on diagnostic imaging of breast: Secondary | ICD-10-CM | POA: Insufficient documentation

## 2020-07-09 DIAGNOSIS — N6320 Unspecified lump in the left breast, unspecified quadrant: Secondary | ICD-10-CM

## 2020-07-10 ENCOUNTER — Other Ambulatory Visit: Payer: Self-pay | Admitting: Family Medicine

## 2020-07-10 DIAGNOSIS — N6320 Unspecified lump in the left breast, unspecified quadrant: Secondary | ICD-10-CM

## 2020-07-10 DIAGNOSIS — R928 Other abnormal and inconclusive findings on diagnostic imaging of breast: Secondary | ICD-10-CM

## 2020-07-18 ENCOUNTER — Ambulatory Visit
Admission: RE | Admit: 2020-07-18 | Discharge: 2020-07-18 | Disposition: A | Discharge status: Home / Self Care | Payer: Federal, State, Local not specified - PPO | Source: Ambulatory Visit | Attending: Family Medicine | Admitting: Family Medicine

## 2020-07-18 ENCOUNTER — Other Ambulatory Visit: Payer: Self-pay

## 2020-07-18 DIAGNOSIS — R928 Other abnormal and inconclusive findings on diagnostic imaging of breast: Secondary | ICD-10-CM

## 2020-07-18 DIAGNOSIS — N6320 Unspecified lump in the left breast, unspecified quadrant: Secondary | ICD-10-CM | POA: Insufficient documentation

## 2020-07-18 DIAGNOSIS — N6082 Other benign mammary dysplasias of left breast: Secondary | ICD-10-CM | POA: Diagnosis not present

## 2020-07-18 DIAGNOSIS — N6321 Unspecified lump in the left breast, upper outer quadrant: Secondary | ICD-10-CM | POA: Diagnosis not present

## 2020-07-18 HISTORY — PX: BREAST BIOPSY: SHX20

## 2020-07-21 LAB — SURGICAL PATHOLOGY

## 2020-09-12 ENCOUNTER — Ambulatory Visit: Payer: Federal, State, Local not specified - PPO | Admitting: Family Medicine

## 2020-09-22 NOTE — Progress Notes (Signed)
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 Allena Katz in April and holter monitor negative. She states palpitation finally resolved, she still has decrease in sense of smell.    Prediabetes: last A1C was done 09/2018 was  5.7% but repeat in 2021 was normal, last check up to 5.7 % again  She  denies polyphagia, polyuria or polydipsia. She states she will try to resume a healthier diet    HTN: had multiple tests done, including for rule out of hyperaldosteronism or pheochromocytoma. SBP is usually around 120's here and at home but DBP has been in the mid 80's , she states snores a lot at night. Discussed sleep study, symptoms worse when she gaines weight.    Familal Hyperlipidemia: mother and father have dyslipidemia diagnosed in their 24's , father had stents placed in his 47's also. She states strokes and heart disease is very big in her mother's side of the family. Patient  LDL was as high as  207 we gave her Crestor 20 and LDL went down to 84, however she has noticed increase in arthralgias on her hands and states when she stopped taking medication symptoms resolved within one week. She would like to try a different medication. 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 She states currently doing well, takes Bentyl very seldom, Bristol scale of 4, no abdominal cramping at this time, and not missing work. She states having children, hysterectomy and Cymbalta seems to  have helped with symptoms.  She still has Bentyl at home but has not taken it in about 2 months.    GAD: she is on 90 mg Cymbalta , since last visit she has seen Dr. Allena Katz ( cardiologist ) and holter monitor showed sinus rhythm. She states Buspar made her feel more moody. Hydroxyzine makes her sleepy and disconnected. She has tried Zoloft but it caused hair loss, lexapro made her emotionless. She states work has been very stressful. She works at [employer] and they have been super busy during the pandemic. Marland Kitchen She is doing better at work lately  She is able to be calm at home, no losing her patience with her kids as much.    Arthralgia and FMS : positive ANA, she initially saw seen by Dr. Renard Matter but given reassurance and advised tylenol and ibuprofen prn, we rechecked labs back in 07/2017 and sed rate was elevated at 38 but normal CRP at 2.0   Symptoms got worse over the Spring 2020  , she decided to go to [facility] appointment was 10/2018 and  was given 5 weeks of prednisone taper and Gabapentin 100 mg - now taking it 4 times daily . She states medications helped her significantly, joints on her fingers finally went down in size and pain was controlled, She saw Dr. Aundria Rud end of 2020 and was placed on Plaquenil but she stopped taking it about  3 months ago because she was feeling tired with medication, she has contacted her but no response yet, we will recheck labs today. Pain today from FMS is zero, joints also good at a zero  Eczema: seen by dermatologist and using Halog but medication has changed recently, she will bring it with her next time.  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 04/01/2020   Connective tissue disease (HCC) 07/20/2019   Fibromyalgia 07/20/2019   GAD (generalized anxiety disorder) 10/19/2017   Irritable bowel syndrome with diarrhea 07/19/2017    Positive ANA (antinuclear antibody) 05/16/2017   History of total vaginal hysterectomy (TVH) 05/11/2017   History of back surgery 08/19/2016   Aortic valve sclerosis 08/03/2016   Gastroesophageal reflux disease 07/23/2016   Essential hypertension, benign 07/08/2015   Vitamin D insufficiency 05/28/2015   Cardiac murmur 02/18/2015   Prediabetes 12/11/2014   Hyperlipidemia 12/11/2014   Anxiety 10/30/2014    Past Surgical History:  Procedure Laterality Date   BREAST BIOPSY Left 07/18/2020   Q 2:00 u/s bx path pending   CESAREAN SECTION     x 2   HERNIA REPAIR     umbilicus   MICRODISCECTOMY LUMBAR  08/19/2016   L4-L5   MOLE REMOVAL     multiple   VAGINAL HYSTERECTOMY  10/2011    Family History  Problem Relation Age of Onset   Osteoporosis Mother    Endometriosis Mother    Hypertension Mother    Diabetes Father    Heart disease Father    Hypertension Father    Colon polyps Father    Thyroid disease Sister    Hypertension Brother    Hyperlipidemia Brother    Diabetes Maternal Grandfather    Colon cancer Paternal Grandmother    Diabetes Paternal Grandfather    Stroke Maternal Grandmother    Breast cancer Neg Hx    Ovarian cancer Neg Hx     Social History   Tobacco Use   Smoking status: Never   Smokeless tobacco: Never  Substance Use Topics   Alcohol use: No    Alcohol/week: 0.0 standard drinks     Current Outpatient Medications:    b complex vitamins capsule, Take by mouth., Disp: , Rfl:    cetirizine (ZYRTEC) 10 MG tablet, Take by mouth as needed. , Disp: , Rfl:    Cholecalciferol (VITAMIN D) 50 MCG (2000 UT) CAPS, Take 1 capsule (2,000 Units total) by mouth daily., Disp: 100 capsule, Rfl: 1   dicyclomine (BENTYL) 10 MG capsule, Take 1 capsule (10 mg total) by mouth 3 (three) times daily as needed., Disp: 90 capsule, Rfl: 0   hydroxychloroquine (PLAQUENIL) 200 MG tablet, Take 2 tablets by mouth daily., Disp: , Rfl:    pravastatin (PRAVACHOL) 40 MG tablet,  Take 1 tablet (40 mg total) by mouth daily., Disp: 90 tablet, Rfl: 1   propranolol (INDERAL) 10 MG tablet, Take 10 mg by mouth 2 (two) times daily., Disp: , Rfl:    triamcinolone cream (KENALOG) 0.1 %, Apply 1 application topically 2 (two) times daily., Disp: 453.6 g, Rfl: 0   DULoxetine (CYMBALTA) 30 MG capsule, Take 1 capsule (30 mg total) by mouth daily., Disp: 90 capsule, Rfl: 1   DULoxetine (CYMBALTA) 60 MG capsule, Take 1 capsule (60 mg total) by mouth daily., Disp: 90 capsule, Rfl: 1   gabapentin (NEURONTIN) 100 MG  capsule, Take 1 capsule (100 mg total) by mouth 4 (four) times daily., Disp: 360 capsule, Rfl: 1   losartan (COZAAR) 100 MG tablet, Take 1 tablet (100 mg total) by mouth daily., Disp: 90 tablet, Rfl: 1   meloxicam (MOBIC) 15 MG tablet, Take 1 tablet (15 mg total) by mouth daily., Disp: 90 tablet, Rfl: 1  Allergies  Allergen Reactions   Ciprofloxacin Hcl Other (See Comments)   Shellfish Allergy    Sulfamethoxazole-Trimethoprim Other (See Comments)    I personally reviewed active problem list, medication list, allergies, family history, social history, health maintenance with the patient/caregiver today.   ROS  Constitutional: Negative for fever or weight change.  Respiratory: Negative for cough and shortness of breath.   Cardiovascular: Negative for chest pain or palpitations.  Gastrointestinal: Negative for abdominal pain, no bowel changes.  Musculoskeletal: Negative for gait problem or joint swelling.  Skin: Negative for rash.  Neurological: Negative for dizziness or headache.  No other specific complaints in a complete review of systems (except as listed in HPI above).   Objective  Vitals:   09/23/20 0751  BP: 122/86  Pulse: 88  Resp: 16  Temp: 97.9 F (36.6 C)  TempSrc: Oral  SpO2: 95%  Weight: 175 lb (79.4 kg)  Height: 5\' 4"  (1.626 m)    Body mass index is 30.04 kg/m.  Physical Exam  Constitutional: Patient appears well-developed and  well-nourished. Obese  No distress.  HEENT: head atraumatic, normocephalic, pupils equal and reactive to light, neck supple Cardiovascular: Normal rate, regular rhythm and normal heart sounds.  No murmur heard. No BLE edema. Pulmonary/Chest: Effort normal and breath sounds normal. No respiratory distress. Abdominal: Soft.  There is no tenderness. Psychiatric: Patient has a normal mood and affect. behavior is normal. Judgment and thought content normal.   Recent Results (from the past 2160 hour(s))  Surgical pathology     Status: None   Collection Time: 07/18/20  9:45 AM  Result Value Ref Range   SURGICAL PATHOLOGY      SURGICAL PATHOLOGY CASE: [id number] PATIENT: Savannah Richards Surgical Pathology Report     Specimen Submitted: A. Breast, left  Clinical History: Mass. Q-shaped clip placed following ultrasound guided biopsy of LEFT breast at 2 o'clock.    DIAGNOSIS: A. BREAST, LEFT AT 2:00, 10 CM FROM NIPPLE; ULTRASOUND-GUIDED CORE NEEDLE BIOPSY: - BENIGN MAMMARY PARENCHYMA WITH FIBROCYSTIC AND APOCRINE CHANGES. - FOCAL STROMAL FIBROSIS, HEMORRHAGE, AND MINIMAL FAT NECROSIS. - NEGATIVE FOR ATYPICAL PROLIFERATIVE BREAST DISEASE.  GROSS DESCRIPTION: A. Labeled: Left breast 2:00 10 cm from nipple Received: Formalin Time/date in fixative: Collected and placed in formalin at 9:40 AM on 07/18/2020 Cold ischemic time: Less than 1 minute Total fixation time: Approximately 7.5 hours Core pieces: 3 Size: Range from 1.4-1.8 cm in length and 0.2 cm in diameter Description: Received are cores of yellow fibrofatty tissue. Ink color: Black Entirely submitted in 1 cassette.  RB 5/ 13/2022   Final Diagnosis performed by 08-19-1991, MD.   Electronically signed 07/21/2020 8:19:14AM The electronic signature indicates that the named Attending Pathologist has evaluated the specimen Technical component performed at [facility], [address], [place], [place] Lab: [phone] Dir:  [phone], MD, MMM  Professional component performed at [facility], [facility], [address] [place], [place], [place] Lab: [id number] Dir: [phone]. Rubinas, MD     PHQ2/9: Depression screen [facility] 2/9 09/23/2020 04/01/2020 07/20/2019 04/20/2019 11/28/2018  Decreased Interest 0 0 2 0 0  Down, Depressed, Hopeless 0  0 2 0 0  PHQ - 2 Score 0 0 4 0 0  Altered sleeping - 0 0 0 0  Tired, decreased energy - 2 2 0 1  Change in appetite - 0 0 0 0  Feeling bad or failure about yourself  - 0 0 0 0  Trouble concentrating - 0 1 0 0  Moving slowly or fidgety/restless - 0 0 0 0  Suicidal thoughts - 0 0 0 0  PHQ-9 Score - 2 7 0 1  Difficult doing work/chores - - Somewhat difficult - Not difficult at all  Some recent data might be hidden    phq 9 is negative   Fall Risk: Fall Risk  09/23/2020 04/01/2020 07/20/2019 04/20/2019 11/28/2018  Falls in the past year? 0 0 0 0 0  Number falls in past yr: 0 0 0 0 0  Injury with Fall? 0 0 0 0 0  Follow up - - - - -      Functional Status Survey: Is the patient deaf or have difficulty hearing?: No Does the patient have difficulty seeing, even when wearing glasses/contacts?: No Does the patient have difficulty concentrating, remembering, or making decisions?: No Does the patient have difficulty walking or climbing stairs?: No Does the patient have difficulty dressing or bathing?: No Does the patient have difficulty doing errands alone such as visiting a doctor's office or shopping?: No    Assessment & Plan  1. GAD (generalized anxiety disorder)  - DULoxetine (CYMBALTA) 30 MG capsule; Take 1 capsule (30 mg total) by mouth daily.  Dispense: 90 capsule; Refill: 1 - DULoxetine (CYMBALTA) 60 MG capsule; Take 1 capsule (60 mg total) by mouth daily.  Dispense: 90 capsule; Refill: 1  2. Fibromyalgia  - DULoxetine (CYMBALTA) 30 MG capsule; Take 1 capsule (30 mg total) by mouth daily.  Dispense: 90 capsule; Refill: 1 - DULoxetine  (CYMBALTA) 60 MG capsule; Take 1 capsule (60 mg total) by mouth daily.  Dispense: 90 capsule; Refill: 1 - gabapentin (NEURONTIN) 100 MG capsule; Take 1 capsule (100 mg total) by mouth 4 (four) times daily.  Dispense: 360 capsule; Refill: 1  3. Essential hypertension, benign  - losartan (COZAAR) 100 MG tablet; Take 1 tablet (100 mg total) by mouth daily.  Dispense: 90 tablet; Refill: 1  4. Pure hypercholesterolemia  - pravastatin (PRAVACHOL) 40 MG tablet; Take 1 tablet (40 mg total) by mouth daily.  Dispense: 90 tablet; Refill: 1  5. Vitamin D deficiency   6. Connective tissue disease (HCC)  - meloxicam (MOBIC) 15 MG tablet; Take 1 tablet (15 mg total) by mouth daily.  Dispense: 90 tablet; Refill: 1  7. Irritable bowel syndrome with diarrhea   8. Eczema of both hands   9. Pre-diabetes

## 2020-09-23 ENCOUNTER — Other Ambulatory Visit: Payer: Self-pay

## 2020-09-23 ENCOUNTER — Encounter: Payer: Self-pay | Admitting: Family Medicine

## 2020-09-23 ENCOUNTER — Ambulatory Visit: Payer: Federal, State, Local not specified - PPO | Admitting: Family Medicine

## 2020-09-23 VITALS — BP 122/86 | HR 88 | Temp 97.9°F | Resp 16 | Ht 64.0 in | Wt 175.0 lb

## 2020-09-23 DIAGNOSIS — I10 Essential (primary) hypertension: Secondary | ICD-10-CM | POA: Diagnosis not present

## 2020-09-23 DIAGNOSIS — F411 Generalized anxiety disorder: Secondary | ICD-10-CM

## 2020-09-23 DIAGNOSIS — M359 Systemic involvement of connective tissue, unspecified: Secondary | ICD-10-CM

## 2020-09-23 DIAGNOSIS — L309 Dermatitis, unspecified: Secondary | ICD-10-CM

## 2020-09-23 DIAGNOSIS — E559 Vitamin D deficiency, unspecified: Secondary | ICD-10-CM | POA: Diagnosis not present

## 2020-09-23 DIAGNOSIS — M797 Fibromyalgia: Secondary | ICD-10-CM | POA: Diagnosis not present

## 2020-09-23 DIAGNOSIS — R7303 Prediabetes: Secondary | ICD-10-CM

## 2020-09-23 DIAGNOSIS — K580 Irritable bowel syndrome with diarrhea: Secondary | ICD-10-CM

## 2020-09-23 DIAGNOSIS — E7800 Pure hypercholesterolemia, unspecified: Secondary | ICD-10-CM

## 2020-09-23 MED ORDER — LOSARTAN POTASSIUM 100 MG PO TABS: 100.0000 mg | ORAL_TABLET | Freq: Every day | ORAL | 1 refills | Status: AC

## 2020-09-23 MED ORDER — DULOXETINE HCL 30 MG PO CPEP: 30.0000 mg | ORAL_CAPSULE | Freq: Every day | ORAL | 1 refills | Status: AC

## 2020-09-23 MED ORDER — GABAPENTIN 100 MG PO CAPS: 100.0000 mg | ORAL_CAPSULE | Freq: Four times a day (QID) | ORAL | 1 refills | Status: DC

## 2020-09-23 MED ORDER — DULOXETINE HCL 60 MG PO CPEP: 60.0000 mg | ORAL_CAPSULE | Freq: Every day | ORAL | 1 refills | Status: AC

## 2020-09-23 MED ORDER — PRAVASTATIN SODIUM 40 MG PO TABS: 40.0000 mg | ORAL_TABLET | Freq: Every day | ORAL | 1 refills | Status: DC

## 2020-09-23 MED ORDER — MELOXICAM 15 MG PO TABS: 15.0000 mg | ORAL_TABLET | Freq: Every day | ORAL | 1 refills | Status: AC

## 2020-10-08 ENCOUNTER — Encounter: Payer: Self-pay | Admitting: Family Medicine

## 2020-10-08 ENCOUNTER — Other Ambulatory Visit: Payer: Self-pay | Admitting: Family Medicine

## 2020-10-08 MED ORDER — SIMVASTATIN 20 MG PO TABS: 20.0000 mg | ORAL_TABLET | Freq: Every day | ORAL | 1 refills | Status: AC

## 2020-10-10 ENCOUNTER — Encounter: Payer: Self-pay | Admitting: Family Medicine

## 2020-10-30 ENCOUNTER — Encounter: Payer: Self-pay | Admitting: Family Medicine

## 2020-11-02 ENCOUNTER — Other Ambulatory Visit: Payer: Self-pay | Admitting: Family Medicine

## 2020-11-02 DIAGNOSIS — T466X5A Adverse effect of antihyperlipidemic and antiarteriosclerotic drugs, initial encounter: Secondary | ICD-10-CM

## 2020-11-02 DIAGNOSIS — G720 Drug-induced myopathy: Secondary | ICD-10-CM

## 2020-11-02 MED ORDER — REPATHA 140 MG/ML ~~LOC~~ SOSY: 1.0000 mL | PREFILLED_SYRINGE | SUBCUTANEOUS | 1 refills | Status: AC

## 2020-11-03 ENCOUNTER — Other Ambulatory Visit: Payer: Self-pay | Admitting: Family Medicine

## 2020-11-03 MED ORDER — GABAPENTIN 300 MG PO CAPS: 300.0000 mg | ORAL_CAPSULE | Freq: Two times a day (BID) | ORAL | 0 refills | Status: AC

## 2020-11-04 DIAGNOSIS — L308 Other specified dermatitis: Secondary | ICD-10-CM | POA: Diagnosis not present

## 2020-11-04 DIAGNOSIS — R21 Rash and other nonspecific skin eruption: Secondary | ICD-10-CM | POA: Diagnosis not present

## 2020-11-04 DIAGNOSIS — L400 Psoriasis vulgaris: Secondary | ICD-10-CM | POA: Diagnosis not present

## 2020-11-11 ENCOUNTER — Encounter: Payer: Self-pay | Admitting: Family Medicine

## 2020-11-11 ENCOUNTER — Telehealth: Payer: Self-pay

## 2020-11-11 NOTE — Telephone Encounter (Signed)
Spoke with [employer] as well as [employer] with outcome of reinitializing a PA for [employer]. [employer] could not tell why there had not been a determination with the one previously stating it was an insurance issue. [employer] representative stated patient's most recent labs did not support criteria for the need for Repatha, however we re-did the PA and rep stated it had to go to a higher department for determination and she provided me with a fax number to get a "head start" on the process to fax last office visit notes and lab documentation. Information was faxed with confirmation. [phone]) Will follow up this afternoon to see if a determination has been made and notify pharmacy and patient.

## 2020-11-11 NOTE — Telephone Encounter (Signed)
Spoke with [employer] as well as [employer] insurance company representatives with outcome of reinitializing a PA for Repatha. [employer] could not tell why there had not been a determination with the one previously stating it was an insurance issue. [employer] representative stated patient's most recent labs did not support criteria for the need for Repatha, however we re-did the PA and rep stated it had to go to a higher department for determination and she provided me with a fax number to get a "head start" on the process to fax last office visit notes and lab documentation. Information was faxed with confirmation. ([phone]) Will follow up this afternoon to see if a determination has been made and notify pharmacy and patient.  

## 2020-11-18 DIAGNOSIS — K080 Exfoliation of teeth due to systemic causes: Secondary | ICD-10-CM | POA: Diagnosis not present

## 2020-12-15 DIAGNOSIS — F418 Other specified anxiety disorders: Secondary | ICD-10-CM | POA: Diagnosis not present

## 2021-01-06 DIAGNOSIS — E559 Vitamin D deficiency, unspecified: Secondary | ICD-10-CM | POA: Diagnosis not present

## 2021-01-06 DIAGNOSIS — E785 Hyperlipidemia, unspecified: Secondary | ICD-10-CM | POA: Diagnosis not present

## 2021-01-06 DIAGNOSIS — R5383 Other fatigue: Secondary | ICD-10-CM | POA: Diagnosis not present

## 2021-01-06 DIAGNOSIS — Z6828 Body mass index (BMI) 28.0-28.9, adult: Secondary | ICD-10-CM | POA: Diagnosis not present

## 2021-01-06 DIAGNOSIS — F418 Other specified anxiety disorders: Secondary | ICD-10-CM | POA: Diagnosis not present

## 2021-01-06 DIAGNOSIS — E663 Overweight: Secondary | ICD-10-CM | POA: Diagnosis not present

## 2021-02-03 ENCOUNTER — Telehealth: Payer: Self-pay | Admitting: Family Medicine

## 2021-03-26 ENCOUNTER — Ambulatory Visit: Payer: Federal, State, Local not specified - PPO | Admitting: Family Medicine

## 2021-03-27 ENCOUNTER — Other Ambulatory Visit: Payer: Self-pay | Admitting: Family Medicine

## 2021-03-27 DIAGNOSIS — I10 Essential (primary) hypertension: Secondary | ICD-10-CM

## 2021-03-27 DIAGNOSIS — M359 Systemic involvement of connective tissue, unspecified: Secondary | ICD-10-CM

## 2021-05-01 ENCOUNTER — Other Ambulatory Visit: Payer: Self-pay | Admitting: Family Medicine

## 2021-05-01 DIAGNOSIS — M359 Systemic involvement of connective tissue, unspecified: Secondary | ICD-10-CM

## 2021-05-01 DIAGNOSIS — I10 Essential (primary) hypertension: Secondary | ICD-10-CM

## 2021-05-05 NOTE — Telephone Encounter (Signed)
Lvm to schedule appt for med refills 

## 2021-05-05 NOTE — Telephone Encounter (Signed)
Have already placed a message saying lvm to call and schedule appt for refills

## 2021-05-19 ENCOUNTER — Other Ambulatory Visit: Payer: Self-pay | Admitting: Family Medicine

## 2021-05-19 DIAGNOSIS — Z1231 Encounter for screening mammogram for malignant neoplasm of breast: Secondary | ICD-10-CM

## 2021-06-24 ENCOUNTER — Ambulatory Visit
Admission: RE | Admit: 2021-06-24 | Discharge: 2021-06-24 | Disposition: A | Discharge status: Home / Self Care | Payer: Federal, State, Local not specified - PPO | Source: Ambulatory Visit | Attending: Family Medicine | Admitting: Family Medicine

## 2021-06-24 DIAGNOSIS — Z1231 Encounter for screening mammogram for malignant neoplasm of breast: Secondary | ICD-10-CM | POA: Insufficient documentation

## 2021-09-21 ENCOUNTER — Other Ambulatory Visit: Payer: Self-pay | Admitting: Family Medicine

## 2021-09-22 NOTE — Telephone Encounter (Signed)
Lvm letting pt know to sch an appt and that she can purchase her vitamin supplements otc until she is able to get in.

## 2022-09-01 ENCOUNTER — Other Ambulatory Visit: Payer: Self-pay | Admitting: Family Medicine

## 2022-09-01 DIAGNOSIS — Z1231 Encounter for screening mammogram for malignant neoplasm of breast: Secondary | ICD-10-CM

## 2022-10-01 ENCOUNTER — Ambulatory Visit
Admission: RE | Admit: 2022-10-01 | Discharge: 2022-10-01 | Disposition: A | Discharge status: Home / Self Care | Payer: Federal, State, Local not specified - PPO | Source: Ambulatory Visit | Attending: Family Medicine | Admitting: Family Medicine

## 2022-10-01 DIAGNOSIS — Z1231 Encounter for screening mammogram for malignant neoplasm of breast: Secondary | ICD-10-CM | POA: Diagnosis present

## 2022-10-04 ENCOUNTER — Encounter: Payer: Self-pay | Admitting: Family Medicine

## 2022-10-07 ENCOUNTER — Other Ambulatory Visit: Payer: Self-pay | Admitting: Family Medicine

## 2022-10-07 DIAGNOSIS — N63 Unspecified lump in unspecified breast: Secondary | ICD-10-CM

## 2022-10-07 DIAGNOSIS — R928 Other abnormal and inconclusive findings on diagnostic imaging of breast: Secondary | ICD-10-CM

## 2022-10-11 ENCOUNTER — Ambulatory Visit
Admission: RE | Admit: 2022-10-11 | Discharge: 2022-10-11 | Disposition: A | Discharge status: Home / Self Care | Payer: Federal, State, Local not specified - PPO | Source: Ambulatory Visit | Attending: Family Medicine | Admitting: Family Medicine

## 2022-10-11 DIAGNOSIS — N63 Unspecified lump in unspecified breast: Secondary | ICD-10-CM

## 2022-10-11 DIAGNOSIS — R928 Other abnormal and inconclusive findings on diagnostic imaging of breast: Secondary | ICD-10-CM | POA: Diagnosis present

## 2023-08-04 LAB — HM MAMMOGRAPHY

## 2024-02-12 IMAGING — MG MM DIGITAL SCREENING BILAT W/ TOMO AND CAD
8 series · 8 of 24 positions shown · non-contrast
Comparison: Previous exam(s).

CLINICAL DATA: Screening.

EXAM:
DIGITAL SCREENING BILATERAL MAMMOGRAM WITH TOMOSYNTHESIS AND CAD
TECHNIQUE: Bilateral screening digital craniocaudal and mediolateral oblique
mammograms were obtained. Bilateral screening digital breast
tomosynthesis was performed. The images were evaluated with
computer-aided detection.

[R MLO synth-2D]
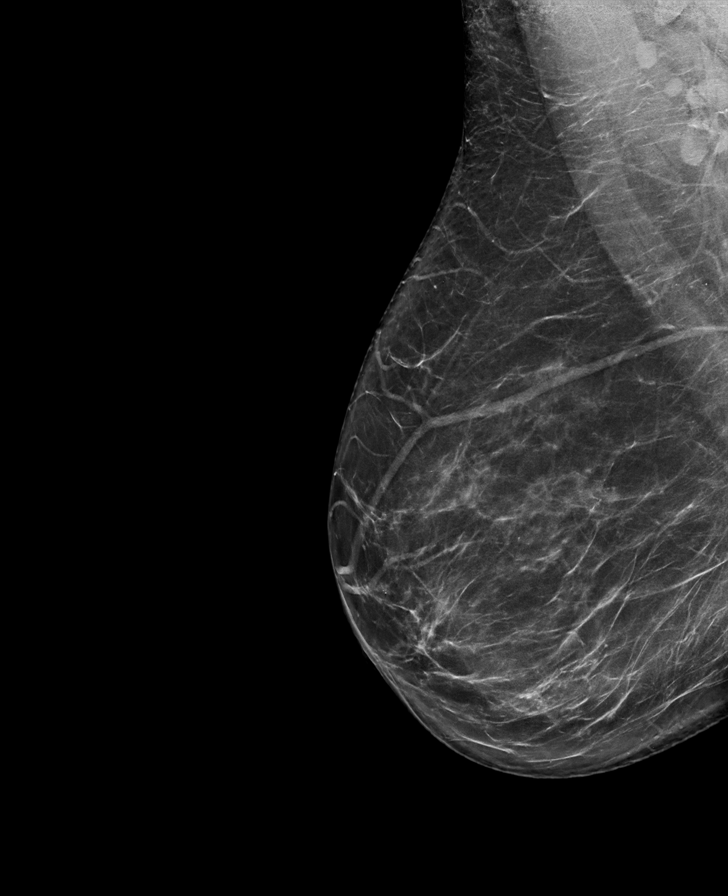

[R CC synth-2D]
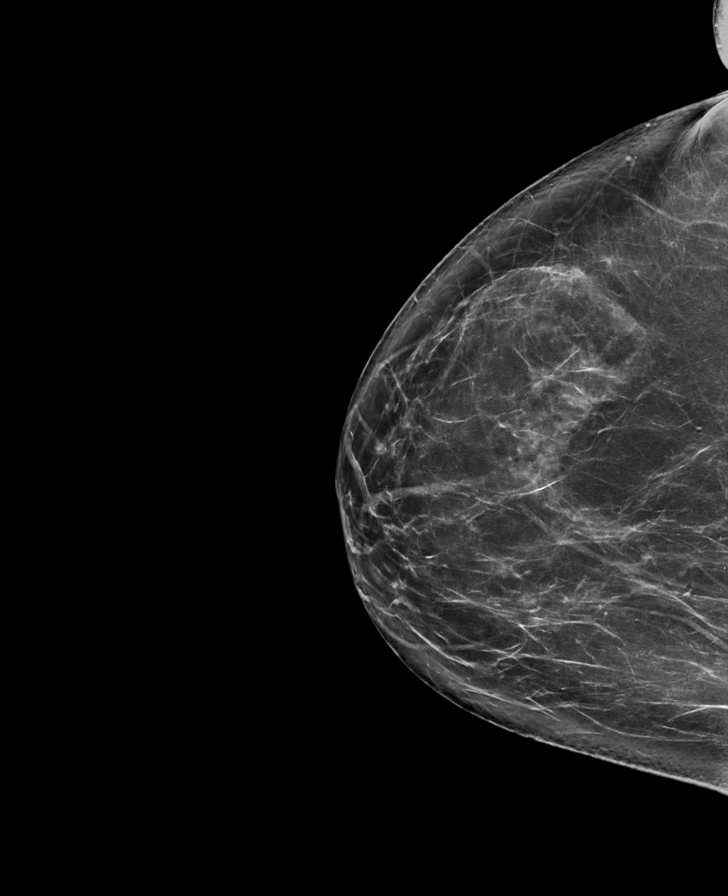

[L MLO synth-2D]
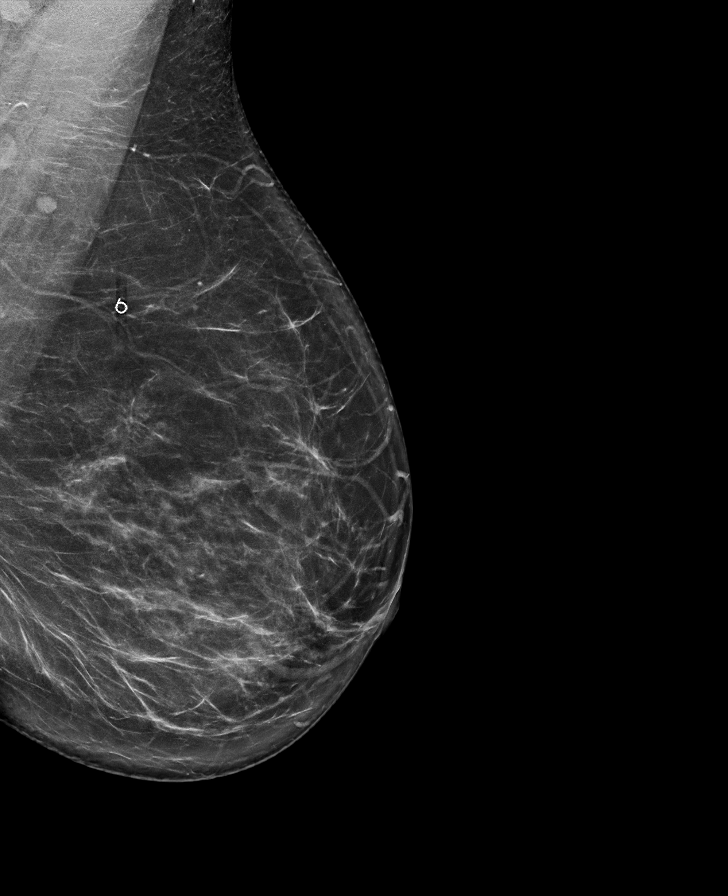

[L CC synth-2D]
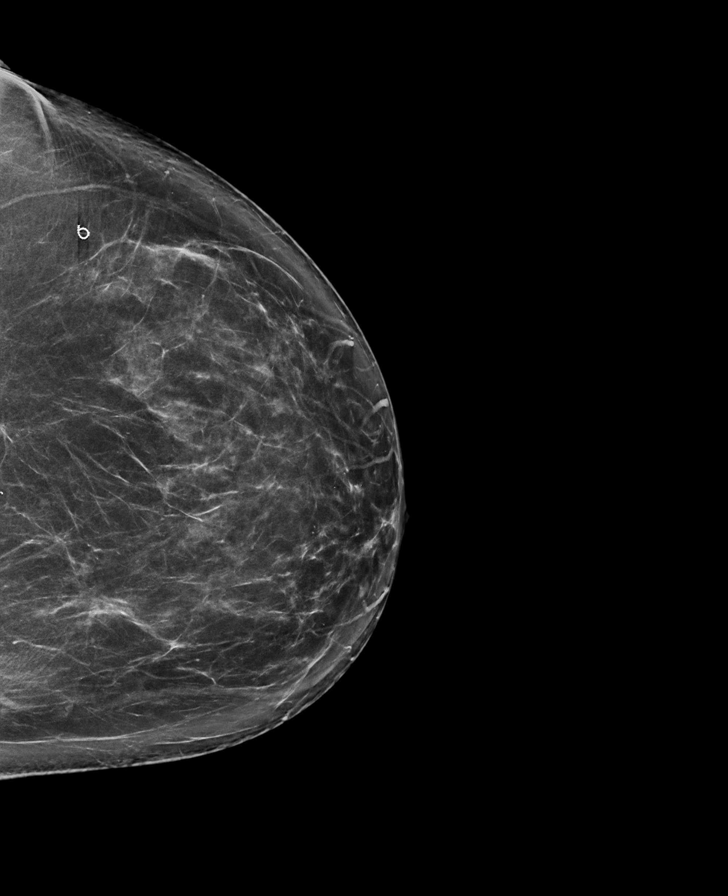

[L CC tomo · tomo slice 45/88.0]
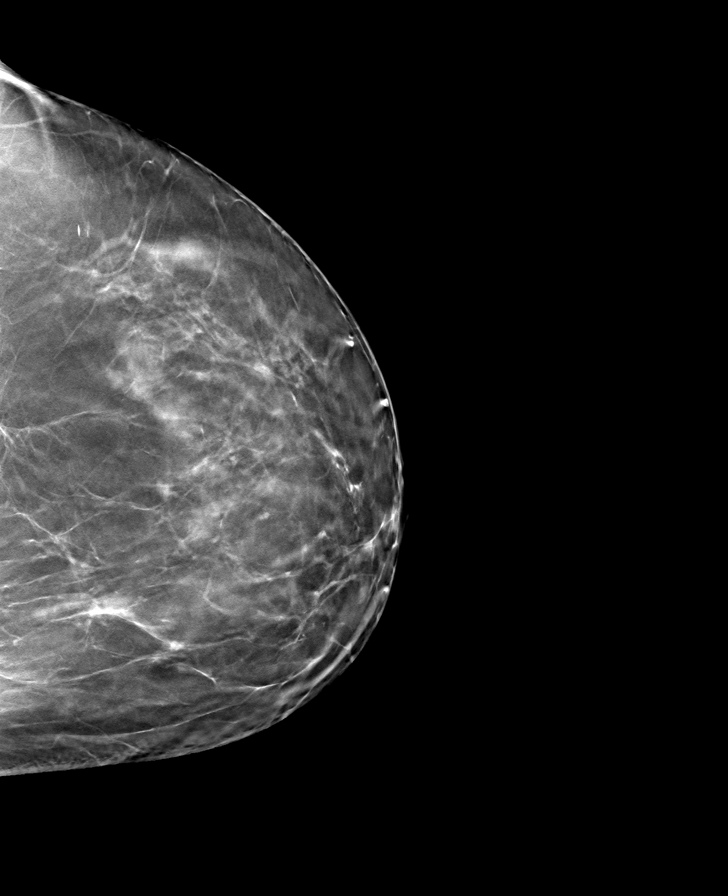

[L MLO tomo · tomo slice 43/86.0]
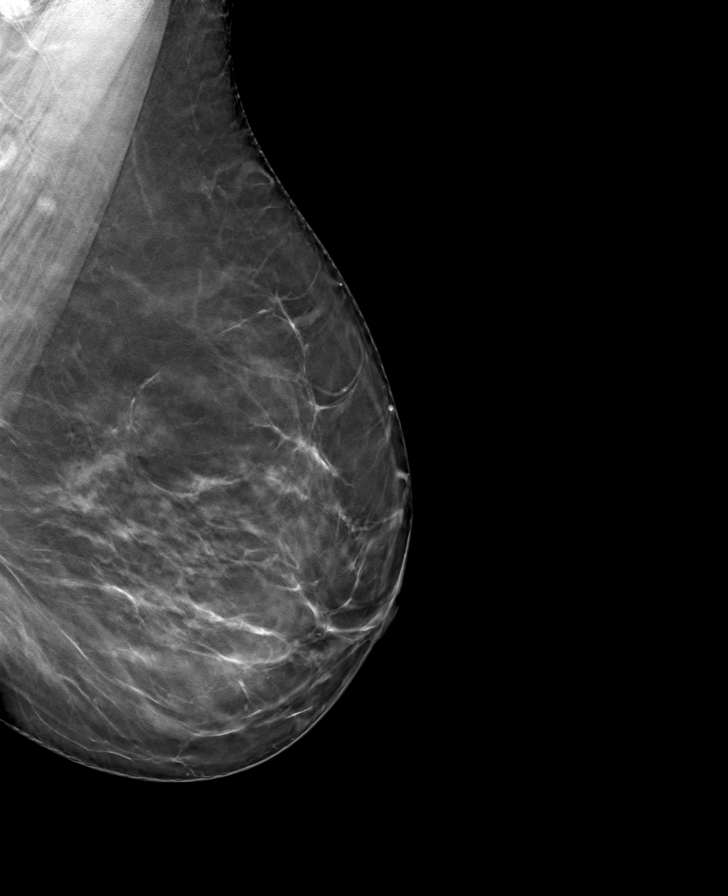

[R CC tomo · tomo slice 43/85.0]
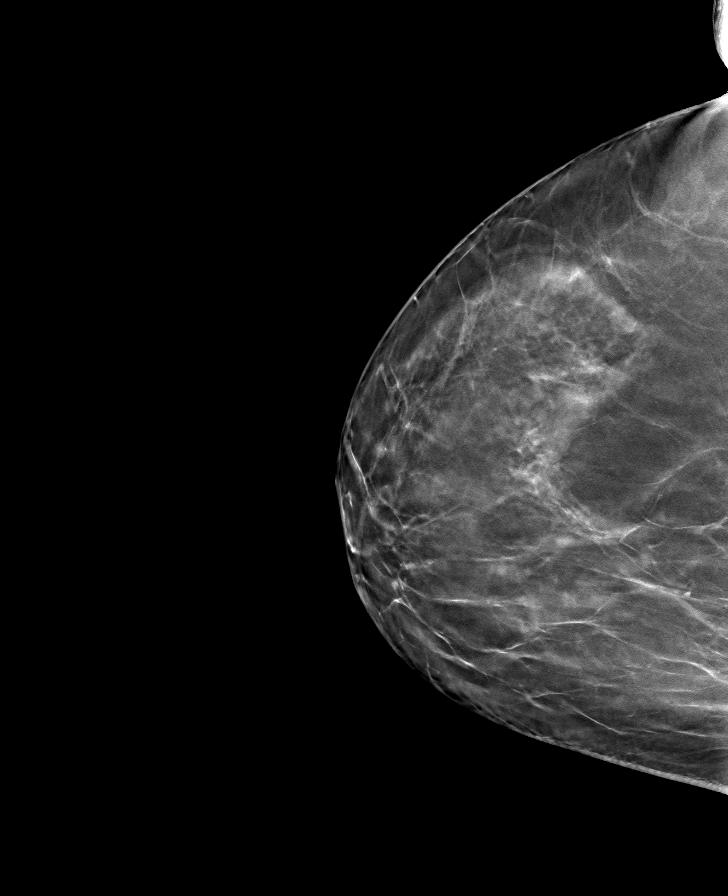

[R MLO tomo · tomo slice 43/86.0]
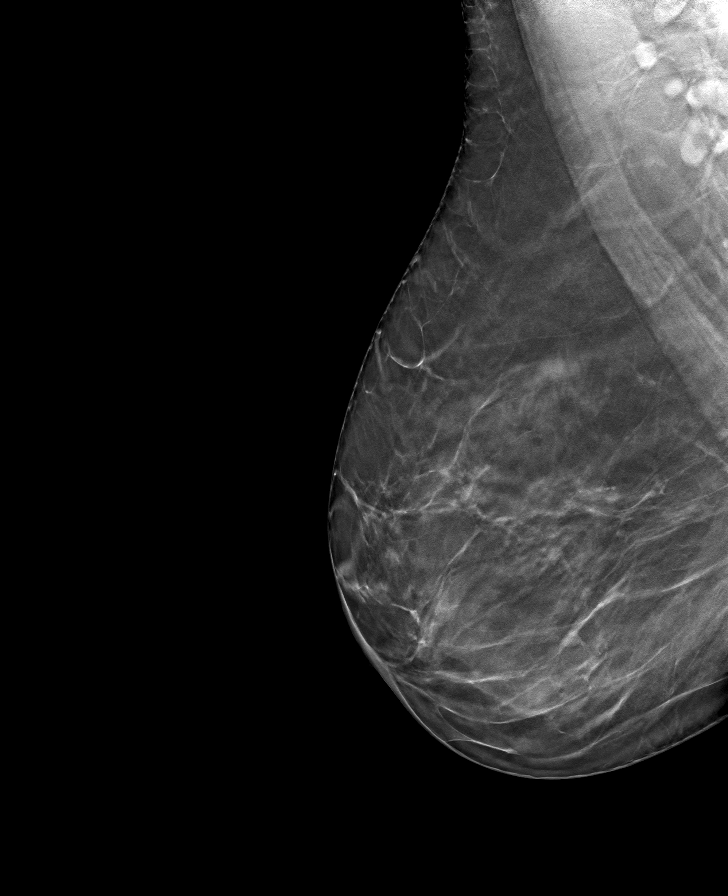

[8 of 24 positions shown; findings below may reference images not displayed]

ACR Breast Density Category b: There are scattered areas of
fibroglandular density.
FINDINGS: There are no findings suspicious for malignancy.
IMPRESSION: No mammographic evidence of malignancy. A result letter of this
screening mammogram will be mailed directly to the patient.

RECOMMENDATION:
Screening mammogram in one year. (Code:51-O-LD2)

BI-RADS CATEGORY  1: Negative.
